# Patient Record
Sex: Female | Born: 1965 | Race: White | Hispanic: No | Marital: Married | Smoking: Never smoker
Health system: Southern US, Community
[De-identification: ages and names within clinical notes are randomized; demographics above are authoritative.]

## PROBLEM LIST (undated history)

## (undated) DIAGNOSIS — M1990 Unspecified osteoarthritis, unspecified site: Secondary | ICD-10-CM

## (undated) DIAGNOSIS — D649 Anemia, unspecified: Secondary | ICD-10-CM

## (undated) DIAGNOSIS — B001 Herpesviral vesicular dermatitis: Secondary | ICD-10-CM

## (undated) DIAGNOSIS — E782 Mixed hyperlipidemia: Secondary | ICD-10-CM

## (undated) DIAGNOSIS — T7840XA Allergy, unspecified, initial encounter: Secondary | ICD-10-CM

## (undated) DIAGNOSIS — R7989 Other specified abnormal findings of blood chemistry: Secondary | ICD-10-CM

## (undated) HISTORY — PX: TONSILLECTOMY: SHX5217

## (undated) HISTORY — PX: REFRACTIVE SURGERY: SHX103

## (undated) HISTORY — DX: Other specified abnormal findings of blood chemistry: R79.89

## (undated) HISTORY — DX: Mixed hyperlipidemia: E78.2

## (undated) HISTORY — DX: Herpesviral vesicular dermatitis: B00.1

## (undated) HISTORY — DX: Allergy, unspecified, initial encounter: T78.40XA

## (undated) HISTORY — PX: WISDOM TOOTH EXTRACTION: SHX21

## (undated) HISTORY — PX: EYE SURGERY: SHX253

---

## 1993-04-27 HISTORY — PX: DILATION AND CURETTAGE OF UTERUS: SHX78

## 2001-11-08 ENCOUNTER — Other Ambulatory Visit: Admission: RE | Admit: 2001-11-08 | Discharge: 2001-11-08 | Payer: Self-pay | Admitting: *Deleted

## 2003-02-01 ENCOUNTER — Other Ambulatory Visit: Admission: RE | Admit: 2003-02-01 | Discharge: 2003-02-01 | Payer: Self-pay | Admitting: *Deleted

## 2004-05-05 ENCOUNTER — Other Ambulatory Visit: Admission: RE | Admit: 2004-05-05 | Discharge: 2004-05-05 | Payer: Self-pay | Admitting: Family Medicine

## 2004-06-04 ENCOUNTER — Ambulatory Visit (HOSPITAL_COMMUNITY): Admission: RE | Admit: 2004-06-04 | Discharge: 2004-06-04 | Payer: Self-pay | Admitting: Family Medicine

## 2006-02-18 ENCOUNTER — Other Ambulatory Visit: Admission: RE | Admit: 2006-02-18 | Discharge: 2006-02-18 | Payer: Self-pay | Admitting: Family Medicine

## 2007-12-01 ENCOUNTER — Other Ambulatory Visit: Admission: RE | Admit: 2007-12-01 | Discharge: 2007-12-01 | Payer: Self-pay | Admitting: Family Medicine

## 2007-12-09 ENCOUNTER — Ambulatory Visit (HOSPITAL_COMMUNITY): Admission: RE | Admit: 2007-12-09 | Discharge: 2007-12-09 | Payer: Self-pay | Admitting: Family Medicine

## 2007-12-09 LAB — HM MAMMOGRAPHY: HM Mammogram: NORMAL

## 2011-04-09 ENCOUNTER — Encounter: Payer: Self-pay | Admitting: *Deleted

## 2011-04-13 ENCOUNTER — Encounter: Payer: Self-pay | Admitting: *Deleted

## 2011-04-13 ENCOUNTER — Other Ambulatory Visit: Payer: Self-pay | Admitting: Family Medicine

## 2011-04-13 ENCOUNTER — Encounter: Payer: Self-pay | Admitting: Family Medicine

## 2011-04-13 ENCOUNTER — Ambulatory Visit (INDEPENDENT_AMBULATORY_CARE_PROVIDER_SITE_OTHER): Payer: 59 | Admitting: Family Medicine

## 2011-04-13 ENCOUNTER — Other Ambulatory Visit (HOSPITAL_COMMUNITY)
Admission: RE | Admit: 2011-04-13 | Discharge: 2011-04-13 | Disposition: A | Discharge status: Home / Self Care | Payer: 59 | Source: Ambulatory Visit | Attending: Family Medicine | Admitting: Family Medicine

## 2011-04-13 VITALS — BP 128/86 | HR 96 | Ht 65.5 in | Wt 198.0 lb

## 2011-04-13 DIAGNOSIS — R5383 Other fatigue: Secondary | ICD-10-CM

## 2011-04-13 DIAGNOSIS — Z01419 Encounter for gynecological examination (general) (routine) without abnormal findings: Secondary | ICD-10-CM | POA: Insufficient documentation

## 2011-04-13 DIAGNOSIS — N926 Irregular menstruation, unspecified: Secondary | ICD-10-CM

## 2011-04-13 DIAGNOSIS — J309 Allergic rhinitis, unspecified: Secondary | ICD-10-CM

## 2011-04-13 DIAGNOSIS — M25511 Pain in right shoulder: Secondary | ICD-10-CM

## 2011-04-13 DIAGNOSIS — M25519 Pain in unspecified shoulder: Secondary | ICD-10-CM

## 2011-04-13 DIAGNOSIS — R5381 Other malaise: Secondary | ICD-10-CM

## 2011-04-13 DIAGNOSIS — B001 Herpesviral vesicular dermatitis: Secondary | ICD-10-CM

## 2011-04-13 DIAGNOSIS — E7800 Pure hypercholesterolemia, unspecified: Secondary | ICD-10-CM

## 2011-04-13 DIAGNOSIS — B009 Herpesviral infection, unspecified: Secondary | ICD-10-CM

## 2011-04-13 DIAGNOSIS — Z23 Encounter for immunization: Secondary | ICD-10-CM

## 2011-04-13 DIAGNOSIS — Z0000 Encounter for general adult medical examination without abnormal findings: Secondary | ICD-10-CM

## 2011-04-13 LAB — POCT URINALYSIS DIPSTICK
Blood, UA: NEGATIVE
Glucose, UA: NEGATIVE
Spec Grav, UA: 1.02
Urobilinogen, UA: NEGATIVE
pH, UA: 5

## 2011-04-13 MED ORDER — VALACYCLOVIR HCL 1 G PO TABS: ORAL_TABLET | ORAL | Status: DC

## 2011-04-13 MED ORDER — FLUTICASONE PROPIONATE 50 MCG/ACT NA SUSP: 2.0000 | NASAL | Status: DC | PRN

## 2011-04-13 NOTE — Patient Instructions (Addendum)
HEALTH MAINTENANCE RECOMMENDATIONS:  It is recommended that you get at least 30 minutes of aerobic exercise at least 5 days/week (for weight loss, you may need as much as 60-90 minutes). This can be any activity that gets your heart rate up. This can be divided in 10-15 minute intervals if needed, but try and build up your endurance at least once a week.  Weight bearing exercise is also recommended twice weekly.  Eat a healthy diet with lots of vegetables, fruits and fiber.  "Colorful" foods have a lot of vitamins (ie green vegetables, tomatoes, red peppers, etc).  Limit sweet tea, regular sodas and alcoholic beverages, all of which has a lot of calories and sugar.  Up to 1 alcoholic drink daily may be beneficial for women (unless trying to lose weight, watch sugars).  Drink a lot of water.  Calcium recommendations are 1200-1500 mg daily (1500 mg for postmenopausal women or women without ovaries), and vitamin D 1000 IU daily.  This should be obtained from diet and/or supplements (vitamins), and calcium should not be taken all at once, but in divided doses.  Monthly self breast exams and yearly mammograms for women over the age of 73 is recommended.  Sunscreen of at least SPF 30 should be used on all sun-exposed parts of the skin when outside between the hours of 10 am and 4 pm (not just when at beach or pool, but even with exercise, golf, tennis, and yard work!)  Use a sunscreen that says "broad spectrum" so it covers both UVA and UVB rays, and make sure to reapply every 1-2 hours.  Remember to change the batteries in your smoke detectors when changing your clock times in the spring and fall.  Use your seat belt every time you are in a car, and please drive safely and not be distracted with cell phones and texting while driving.  Please call to schedule your mammogram (and do them yearly) Please schedule an eye exam, as your vision is somewhat diminished (20/50) Please try and find out if your  father's colon polyps were "adenomas". If they were "hyperplastic" polyps, then it is fine to wait until you are 48 for your first colonoscopy. If they were adenomatous, then I'd screen now.  Take EITHER 2 Aleve twice daily with food OR 800 mg (4 tablets of OTC advil) three times daily with food for 7-10 days.  Do range of motion exercises at least twice daily.   Shoulder Exercises--right now start with just STRETCHING, avoid strengthening until pain is better. Do all exercises 10 repetitions, twice daily, holding for 10-15 seconds EXERCISES  RANGE OF MOTION (ROM) AND STRETCHING EXERCISES These exercises may help you when beginning to rehabilitate your injury. Your symptoms may resolve with or without further involvement from your physician, physical therapist or athletic trainer. While completing these exercises, remember:   Restoring tissue flexibility helps normal motion to return to the joints. This allows healthier, less painful movement and activity.   An effective stretch should be held for at least 30 seconds.   A stretch should never be painful. You should only feel a gentle lengthening or release in the stretched tissue.  ROM - Pendulum  Bend at the waist so that your right / left arm falls away from your body. Support yourself with your opposite hand on a solid surface, such as a table or a countertop.   Your right / left arm should be perpendicular to the ground. If it is not perpendicular, you  need to lean over farther. Relax the muscles in your right / left arm and shoulder as much as possible.   Gently sway your hips and trunk so they move your right / left arm without any use of your right / left shoulder muscles.   Progress your movements so that your right / left arm moves side to side, then forward and backward, and finally, both clockwise and counterclockwise.   Complete __________ repetitions in each direction. Many people use this exercise to relieve discomfort in  their shoulder as well as to gain range of motion.  Repeat __________ times. Complete this exercise __________ times per day. STRETCH - Flexion, Standing  Stand with good posture. With an underhand grip on your right / left hand and an overhand grip on the opposite hand, grasp a broomstick or cane so that your hands are a little more than shoulder-width apart.   Keeping your right / left elbow straight and shoulder muscles relaxed, push the stick with your opposite hand to raise your right / left arm in front of your body and then overhead. Raise your arm until you feel a stretch in your right / left shoulder, but before you have increased shoulder pain.   Try to avoid shrugging your right / left shoulder as your arm rises by keeping your shoulder blade tucked down and toward your mid-back spine. Hold __________ seconds.   Slowly return to the starting position.  Repeat __________ times. Complete this exercise __________ times per day. STRETCH - Internal Rotation  Place your right / left hand behind your back, palm-up.   Throw a towel or belt over your opposite shoulder. Grasp the towel/belt with your right / left hand.   While keeping an upright posture, gently pull up on the towel/belt until you feel a stretch in the front of your right / left shoulder.   Avoid shrugging your right / left shoulder as your arm rises by keeping your shoulder blade tucked down and toward your mid-back spine.   Hold __________. Release the stretch by lowering your opposite hand.  Repeat __________ times. Complete this exercise __________ times per day. STRETCH - External Rotation and Abduction  Stagger your stance through a doorframe. It does not matter which foot is forward.   As instructed by your physician, physical therapist or athletic trainer, place your hands:   And forearms above your head and on the door frame.   And forearms at head-height and on the door frame.   At elbow-height and on the  door frame.   Keeping your head and chest upright and your stomach muscles tight to prevent over-extending your low-back, slowly shift your weight onto your front foot until you feel a stretch across your chest and/or in the front of your shoulders.   Hold __________ seconds. Shift your weight to your back foot to release the stretch.  Repeat __________ times. Complete this stretch __________ times per day.  STRENGTHENING EXERCISES  These exercises may help you when beginning to rehabilitate your injury. They may resolve your symptoms with or without further involvement from your physician, physical therapist or athletic trainer. While completing these exercises, remember:   Muscles can gain both the endurance and the strength needed for everyday activities through controlled exercises.   Complete these exercises as instructed by your physician, physical therapist or athletic trainer. Progress the resistance and repetitions only as guided.   You may experience muscle soreness or fatigue, but the pain or discomfort you  are trying to eliminate should never worsen during these exercises. If this pain does worsen, stop and make certain you are following the directions exactly. If the pain is still present after adjustments, discontinue the exercise until you can discuss the trouble with your clinician.   If advised by your physician, during your recovery, avoid activity or exercises which involve actions that place your right / left hand or elbow above your head or behind your back or head. These positions stress the tissues which are trying to heal.  STRENGTH - Scapular Depression and Adduction  With good posture, sit on a firm chair. Supported your arms in front of you with pillows, arm rests or a table top. Have your elbows in line with the sides of your body.   Gently draw your shoulder blades down and toward your mid-back spine. Gradually increase the tension without tensing the muscles along  the top of your shoulders and the back of your neck.   Hold for __________ seconds. Slowly release the tension and relax your muscles completely before completing the next repetition.   After you have practiced this exercise, remove the arm support and complete it in standing as well as sitting.  Repeat __________ times. Complete this exercise __________ times per day.  STRENGTH - External Rotators  Secure a rubber exercise band/tubing to a fixed object so that it is at the same height as your right / left elbow when you are standing or sitting on a firm surface.   Stand or sit so that the secured exercise band/tubing is at your side that is not injured.   Bend your elbow 90 degrees. Place a folded towel or small pillow under your right / left arm so that your elbow is a few inches away from your side.   Keeping the tension on the exercise band/tubing, pull it away from your body, as if pivoting on your elbow. Be sure to keep your body steady so that the movement is only coming from your shoulder rotating.   Hold __________ seconds. Release the tension in a controlled manner as you return to the starting position.  Repeat __________ times. Complete this exercise __________ times per day.  STRENGTH - Supraspinatus  Stand or sit with good posture. Grasp a __________ weight or an exercise band/tubing so that your hand is "thumbs-up," like when you shake hands.   Slowly lift your right / left hand from your thigh into the air, traveling about 30 degrees from straight out at your side. Lift your hand to shoulder height or as far as you can without increasing any shoulder pain. Initially, many people do not lift their hands above shoulder height.   Avoid shrugging your right / left shoulder as your arm rises by keeping your shoulder blade tucked down and toward your mid-back spine.   Hold for __________ seconds. Control the descent of your hand as you slowly return to your starting position.    Repeat __________ times. Complete this exercise __________ times per day.  STRENGTH - Shoulder Extensors  Secure a rubber exercise band/tubing so that it is at the height of your shoulders when you are either standing or sitting on a firm arm-less chair.   With a thumbs-up grip, grasp an end of the band/tubing in each hand. Straighten your elbows and lift your hands straight in front of you at shoulder height. Step back away from the secured end of band/tubing until it becomes tense.   Squeezing your shoulder blades  together, pull your hands down to the sides of your thighs. Do not allow your hands to go behind you.   Hold for __________ seconds. Slowly ease the tension on the band/tubing as you reverse the directions and return to the starting position.  Repeat __________ times. Complete this exercise __________ times per day.  STRENGTH - Scapular Retractors  Secure a rubber exercise band/tubing so that it is at the height of your shoulders when you are either standing or sitting on a firm arm-less chair.   With a palm-down grip, grasp an end of the band/tubing in each hand. Straighten your elbows and lift your hands straight in front of you at shoulder height. Step back away from the secured end of band/tubing until it becomes tense.   Squeezing your shoulder blades together, draw your elbows back as you bend them. Keep your upper arm lifted away from your body throughout the exercise.   Hold __________ seconds. Slowly ease the tension on the band/tubing as you reverse the directions and return to the starting position.  Repeat __________ times. Complete this exercise __________ times per day. STRENGTH - Scapular Depressors  Find a sturdy chair without wheels, such as a from a dining room table.   Keeping your feet on the floor, lift your bottom from the seat and lock your elbows.   Keeping your elbows straight, allow gravity to pull your body weight down. Your shoulders will rise  toward your ears.   Raise your body against gravity by drawing your shoulder blades down your back, shortening the distance between your shoulders and ears. Although your feet should always maintain contact with the floor, your feet should progressively support less body weight as you get stronger.   Hold __________ seconds. In a controlled and slow manner, lower your body weight to begin the next repetition.  Repeat __________ times. Complete this exercise __________ times per day.  Document Released: 02/25/2005 Document Revised: 12/24/2010 Document Reviewed: 07/26/2008 [facility] Patient Information 2012 [place], [place].

## 2011-04-13 NOTE — Progress Notes (Signed)
Stacy Boyer is a 45 y.o. female who presents for a complete physical.  She has the following concerns: R shoulder pain and decreased range of motion for at least a month.  Pain is a burning sensation running down the front of her upper arm.  Denies neck pain.  Doesn't hurt to vacuum, but hurts to reach up high or behind her--unable to put on her bra.    Health Maintenance: There is no immunization history on file for this patient. Per old records, Td 2003 Last Pap smear: 8/09 Last mammogram: 8/09 Last colonoscopy: never Last DEXA: never Dentist: every 6 months Ophtho: 2 years ago Exercise: none  Past Medical History  Diagnosis Date  . Allergy     seasonal and dust  . Herpes simplex labialis     Past Surgical History  Procedure Date  . Cesarean section   . Tonsillectomy age 45  . Refractive surgery     History   Social History  . Marital Status: Married    Spouse Name: N/A    Number of Children: 2  . Years of Education: N/A   Occupational History  . teaches at [employer] [place]    Social History Main Topics  . Smoking status: Never Smoker   . Smokeless tobacco: Never Used  . Alcohol Use: Yes     less than one drink per week.  . Drug Use: No  . Sexually Active: Yes -- Female partner(s)     husband had vasectomy   Other Topics Concern  . Not on file   Social History Narrative   Lives with 2 sons, husband and 3 dogs    Family History  Problem Relation Age of Onset  . Hyperlipidemia Mother   . Diabetes Mother   . Melanoma Mother   . Arthritis Mother   . Arthritis Father     rheumatoid  . Diabetes Father   . Heart disease Father 79    MI, s/p CABG  . Hypertension Father   . Hyperlipidemia Father   . Diabetes Brother     resolved after gastric bypass/weight loss   . Hypertension Brother     off meds s/p weight loss  . Cancer Paternal Grandmother     breast cancer in 70's  . Heart disease Maternal Grandmother     90 or younger?  . Stroke Maternal  Grandmother     50's   Current outpatient prescriptions:ibuprofen (ADVIL,MOTRIN) 200 MG tablet, Take 400 mg by mouth as needed.  , Disp: , Rfl: ;  Multiple Vitamins-Minerals (MULTIVITAMIN WITH MINERALS) tablet, Take 1 tablet by mouth daily.  , Disp: , Rfl: ;  Probiotic Product (PRO-BIOTIC BLEND) CAPS, Take 1 capsule by mouth daily.  , Disp: , Rfl:  valACYclovir (VALTREX) 1000 MG tablet, Take 2 tablets at onset of cold sore.  Repeat dose of additional 2 tablets in 12 hours (4 tablets per episode), Disp: 30 tablet, Rfl: 1;  DISCONTD: valACYclovir (VALTREX) 1000 MG tablet, Take 1,000 mg by mouth as needed.  , Disp: , Rfl: ;  fluticasone (FLONASE) 50 MCG/ACT nasal spray, Place 2 sprays into the nose as needed for allergies., Disp: 16 g, Rfl: 5 DISCONTD: fluticasone (FLONASE) 50 MCG/ACT nasal spray, Place 2 sprays into the nose as needed.  , Disp: , Rfl:   No Known Allergies  ROS:  The patient denies anorexia, fever, vision changes, decreased hearing, ear pain, sore throat, breast concerns, chest pain, palpitations, dizziness, syncope, dyspnea on exertion, cough, swelling, nausea,  vomiting, diarrhea, constipation, abdominal pain, melena, hematochezia, indigestion/heartburn, hematuria, incontinence, dysuria, irregular menstrual cycles--still fairly regular but have gotten heavier; no vaginal discharge, odor or itch, genital lesions, numbness, tingling, weakness, tremor, suspicious skin lesions, depression, anxiety, abnormal bleeding/bruising, or enlarged lymph nodes. +periods are heavier and seem to last longer. +fatigue; +weight gain over the last several years. Occasional sinus headache. Had some alternating constipation and diarrhea, which has improved since taking probiotics. +some trouble sleeping, thinks related to caffeine.  URI symptoms last week, resolving, just a slight residual cough  PHYSICAL EXAM: Ht 5' 5.5" (1.664 m)  Wt 198 lb (89.812 kg)  BMI 32.45 kg/m2  LMP 03/31/2011  General  Appearance:    Alert, cooperative, no distress, appears stated age, overweight  Head:    Normocephalic, without obvious abnormality, atraumatic  Eyes:    PERRL, conjunctiva/corneas clear, EOM's intact, fundi    benign  Ears:    Normal TM's and external ear canals  Nose:   Nares normal, mucosa normal, no drainage or sinus   tenderness  Throat:   Lips, mucosa, and tongue normal; teeth and gums normal  Neck:   Supple, no lymphadenopathy;  thyroid:  no   enlargement/tenderness/nodules; no carotid   bruit or JVD  Back:    Spine nontender, no curvature, ROM normal, no CVA     tenderness  Lungs:     Clear to auscultation bilaterally without wheezes, rales or     ronchi; respirations unlabored  Chest Wall:    No tenderness or deformity   Heart:    Regular rate and rhythm, S1 and S2 normal, no murmur, rub   or gallop  Breast Exam:    No tenderness, masses, or nipple discharge or inversion.      No axillary lymphadenopathy  Abdomen:     Soft, non-tender, nondistended, normoactive bowel sounds,    no masses, no hepatosplenomegaly  Genitalia:    Normal external genitalia without lesions.  BUS and vagina normal; cervix without lesions, or cervical motion tenderness. No abnormal vaginal discharge.  Uterus and adnexa not enlarged, nontender, no masses.  Pap performed  Rectal:    Normal tone, no masses or tenderness; guaiac negative stool  Extremities:   No clubbing, cyanosis or edema  Pulses:   2+ and symmetric all extremities.  RUE: Slightly limited abduction, forward flexion, significantly reduced internal rotation due to pain.  Normal strength, sensation  Skin:   Skin color, texture, turgor normal, no rashes or lesions  Lymph nodes:   Cervical, supraclavicular, and axillary nodes normal  Neurologic:   CNII-XII intact, normal strength, sensation and gait; reflexes 2+ and symmetric throughout          Psych:   Normal mood, affect, hygiene and grooming.     ASSESSMENT/PLAN:  1. Routine general medical  examination at a health care facility  Visual acuity screening, POCT Urinalysis Dipstick, Cytology - PAP  2. Herpes simplex labialis  valACYclovir (VALTREX) 1000 MG tablet  3. Allergic rhinitis, cause unspecified  fluticasone (FLONASE) 50 MCG/ACT nasal spray  4. Irregular menses  CBC with Differential, TSH  5. Other malaise and fatigue  CBC with Differential, Vitamin D 25 hydroxy, TSH, Comprehensive metabolic panel  6. Pure hypercholesterolemia  Lipid panel  7. Need for Tdap vaccination  Tdap vaccine greater than or equal to 7yo IM  8. Need for prophylactic vaccination and inoculation against influenza  Flu vaccine greater than or equal to 3yo preservative free IM  9. Shoulder pain, right  Shoulder pain: 800 mg ibuprofen three times daily with food (vs Aleve 2 tabs BID).  ROM exercises shown.  Refer to PT if not improving. May need cortisone shot.  Return for re-eval if not improving over the next 2 weeks with NSAIDs and exercises.  NOT to start the strengthening exercises until pain-free.  Discussed monthly self breast exams and yearly mammograms after the age of 29; at least 30 minutes of aerobic activity at least 5 days/week; proper sunscreen use reviewed; healthy diet, including goals of calcium and vitamin D intake and alcohol recommendations (less than or equal to 1 drink/day) reviewed; regular seatbelt use; changing batteries in smoke detectors.  Immunization recommendations discussed--TdaP and flu shot given today.  Colonoscopy recommendations reviewed--age 35, unless mother had adenomatous polyps

## 2011-04-14 LAB — CBC WITH DIFFERENTIAL/PLATELET
Eosinophils Absolute: 0.2 10*3/uL (ref 0.0–0.7)
Eosinophils Relative: 2 % (ref 0–5)
Hemoglobin: 14.2 g/dL (ref 12.0–15.0)
Lymphs Abs: 2.1 10*3/uL (ref 0.7–4.0)
MCH: 30.1 pg (ref 26.0–34.0)
MCV: 92.1 fL (ref 78.0–100.0)
Monocytes Relative: 7 % (ref 3–12)
Platelets: 275 10*3/uL (ref 150–400)
RBC: 4.71 MIL/uL (ref 3.87–5.11)

## 2011-04-14 LAB — COMPREHENSIVE METABOLIC PANEL
Albumin: 4.6 g/dL (ref 3.5–5.2)
BUN: 12 mg/dL (ref 6–23)
Calcium: 9.6 mg/dL (ref 8.4–10.5)
Chloride: 100 mEq/L (ref 96–112)
Glucose, Bld: 88 mg/dL (ref 70–99)
Potassium: 4 mEq/L (ref 3.5–5.3)

## 2011-04-14 LAB — LIPID PANEL
HDL: 59 mg/dL (ref >39)
Triglycerides: 215 mg/dL — ABNORMAL HIGH (ref <150)

## 2011-04-16 ENCOUNTER — Telehealth: Payer: Self-pay | Admitting: Internal Medicine

## 2011-04-16 DIAGNOSIS — R748 Abnormal levels of other serum enzymes: Secondary | ICD-10-CM

## 2011-04-16 DIAGNOSIS — E782 Mixed hyperlipidemia: Secondary | ICD-10-CM

## 2011-04-16 DIAGNOSIS — R7989 Other specified abnormal findings of blood chemistry: Secondary | ICD-10-CM

## 2011-04-16 NOTE — Telephone Encounter (Signed)
I will call back once I get [facility] labs for comparison--please try again to get today.  Thanks

## 2011-04-16 NOTE — Telephone Encounter (Signed)
Spoke with patient.  [facility] labs reviewed--LFT's all normal, and lipids were much better in the past "06 and '09.  Discussed likelihood of this being fatty changes to the liver.  Discussed need for lowfat, low cholesterol diet, and for weight loss.  Discussed avoiding alcohol, excessive tylenol.  Plan is to repeat LFT's in 1 month.  If still elevated, then will need liver u/s.  Will stay on lowfat, low cholesterol diet for 3 months, then re-check lipid (along with LFT's).  Sent low cholesterol info.  All questions answered.  Also will need TSH in 3 months.

## 2011-04-21 ENCOUNTER — Encounter: Payer: Self-pay | Admitting: Family Medicine

## 2011-04-30 ENCOUNTER — Ambulatory Visit: Payer: 59 | Admitting: Family Medicine

## 2011-05-04 ENCOUNTER — Ambulatory Visit (INDEPENDENT_AMBULATORY_CARE_PROVIDER_SITE_OTHER): Payer: 59 | Admitting: Family Medicine

## 2011-05-04 ENCOUNTER — Encounter: Payer: Self-pay | Admitting: Family Medicine

## 2011-05-04 ENCOUNTER — Ambulatory Visit
Admission: RE | Admit: 2011-05-04 | Discharge: 2011-05-04 | Disposition: A | Discharge status: Home / Self Care | Payer: 59 | Source: Ambulatory Visit | Attending: Family Medicine | Admitting: Family Medicine

## 2011-05-04 VITALS — BP 122/86 | HR 68 | Ht 65.5 in | Wt 198.0 lb

## 2011-05-04 DIAGNOSIS — M25511 Pain in right shoulder: Secondary | ICD-10-CM

## 2011-05-04 DIAGNOSIS — M25519 Pain in unspecified shoulder: Secondary | ICD-10-CM

## 2011-05-04 DIAGNOSIS — R7989 Other specified abnormal findings of blood chemistry: Secondary | ICD-10-CM

## 2011-05-04 DIAGNOSIS — E782 Mixed hyperlipidemia: Secondary | ICD-10-CM

## 2011-05-04 NOTE — Patient Instructions (Signed)
Get x-rays of right shoulder and start physical therapy.  Continue with 2 Aleve twice daily. If increased pain, consider returning for cortisone injection.  Continue lowfat, low cholesterol diet.

## 2011-05-04 NOTE — Progress Notes (Signed)
3 week follow up on R shoulder pain-started taking aleve has had some relief in pain but ROM is no better. Has been trying to use her right arm more, and has been doing ROM exercises.  Main limitation is reaching behind her (internal rotation).  We reviewed her lab results--elevated lipids and liver tests and arrangements for follow up.  Past Medical History  Diagnosis Date  . Allergy     seasonal and dust  . Herpes simplex labialis   . Mixed hyperlipidemia   . Other abnormal blood chemistry     Past Surgical History  Procedure Date  . Cesarean section   . Tonsillectomy age 24  . Refractive surgery     History   Social History  . Marital Status: Married    Spouse Name: N/A    Number of Children: 2  . Years of Education: N/A   Occupational History  . teaches at [employer]    Social History Main Topics  . Smoking status: Never Smoker   . Smokeless tobacco: Never Used  . Alcohol Use: Yes     less than one drink per week.  . Drug Use: No  . Sexually Active: Yes -- Female partner(s)     husband had vasectomy   Other Topics Concern  . Not on file   Social History Narrative   Lives with 2 sons, husband and 3 dogs    Family History  Problem Relation Age of Onset  . Hyperlipidemia Mother   . Diabetes Mother   . Melanoma Mother   . Arthritis Mother   . Arthritis Father     rheumatoid  . Diabetes Father   . Heart disease Father 43    MI, s/p CABG  . Hypertension Father   . Hyperlipidemia Father   . Diabetes Brother     resolved after gastric bypass/weight loss   . Hypertension Brother     off meds s/p weight loss  . Cancer Paternal Grandmother     breast cancer in 61's  . Heart disease Maternal Grandmother     54 or younger?  . Stroke Maternal Grandmother     50's   Current Outpatient Prescriptions on File Prior to Visit  Medication Sig Dispense Refill  . fluticasone (FLONASE) 50 MCG/ACT nasal spray Place 2 sprays into the nose as needed for allergies.  16 g   5  . Multiple Vitamins-Minerals (MULTIVITAMIN WITH MINERALS) tablet Take 1 tablet by mouth daily.        . Probiotic Product (PRO-BIOTIC BLEND) CAPS Take 1 capsule by mouth daily.        . valACYclovir (VALTREX) 1000 MG tablet Take 2 tablets at onset of cold sore.  Repeat dose of additional 2 tablets in 12 hours (4 tablets per episode)  30 tablet  1   No Known Allergies  ROS:  Denies abdominal pain, fever, URI symptoms, chest pain or other concerns.  Denies weakness  PHYSICAL EXAM: BP 122/86  Pulse 68  Ht 5' 5.5" (1.664 m)  Wt 198 lb (89.812 kg)  BMI 32.45 kg/m2  LMP 05/01/2011 Well developed, pleasant obese female in no distress Right upper extremity:  Slight discomfort with abduction and forward flexion, decreased ROM by only 5 degrees in these directions.  Significantly limited internal rotation, and pain upon this movement. Pain with internal rotation against resistance on R, but full strength.  No tenderness anteriorly or at [facility] joint. No impingement.  Normal sensation  ASSESSENT/PLAN:  1. Shoulder pain,  right  Ambulatory referral to Physical Therapy, DG Shoulder Right   likely due to tendinitis; possible early frozen shoulder, with some improvement in ROM, but still limited internal rotation  2. Elevated LFTs     re-check in 2 weeks  3. Mixed hyperlipidemia     low cholesterol, lowfat diet and re-check in March    R shoulder tendinitis--PT, continue NSAIDs.  Check x-ray.  Elevated LFT's and cholesterol.  Re-check LFTs in 2 weeks.  Continue low cholesterol diet. Will need lipids checked again in 3 months after appropriate dietary trial, along with TSH.  Lab orders already in system

## 2011-06-02 ENCOUNTER — Other Ambulatory Visit: Payer: 59

## 2011-06-03 ENCOUNTER — Other Ambulatory Visit: Payer: 59

## 2011-06-03 DIAGNOSIS — R748 Abnormal levels of other serum enzymes: Secondary | ICD-10-CM

## 2011-06-03 LAB — HEPATIC FUNCTION PANEL
ALT: 35 U/L (ref 0–35)
AST: 26 U/L (ref 0–37)
Bilirubin, Direct: 0.1 mg/dL (ref 0.0–0.3)
Indirect Bilirubin: 0.2 mg/dL (ref 0.0–0.9)

## 2011-10-08 ENCOUNTER — Ambulatory Visit (INDEPENDENT_AMBULATORY_CARE_PROVIDER_SITE_OTHER): Payer: 59 | Admitting: Family Medicine

## 2011-10-08 ENCOUNTER — Encounter: Payer: Self-pay | Admitting: Family Medicine

## 2011-10-08 VITALS — BP 120/88 | HR 68 | Temp 98.6°F | Ht 65.5 in | Wt 197.0 lb

## 2011-10-08 DIAGNOSIS — R059 Cough, unspecified: Secondary | ICD-10-CM

## 2011-10-08 DIAGNOSIS — R05 Cough: Secondary | ICD-10-CM

## 2011-10-08 MED ORDER — BENZONATATE 200 MG PO CAPS: 200.0000 mg | ORAL_CAPSULE | Freq: Three times a day (TID) | ORAL | Status: AC | PRN

## 2011-10-08 MED ORDER — HYDROCOD POLST-CHLORPHEN POLST 10-8 MG/5ML PO LQCR: 5.0000 mL | Freq: Every evening | ORAL | Status: DC | PRN

## 2011-10-08 NOTE — Patient Instructions (Addendum)
Use the syrup at bedtime--it will make you sleepy and last up to 12 hours, so be careful with driving in the mornings. You may use the pill cough medicine three times during the day, if needed, shouldn't make you sleepy. You may also use mucinex  (plain or DM) along with these other medications, if needed (ie if cough not improving).  Call for z-pak next week if persistent cough.  Return if shortness of breath, pain with breathing, high fevers, etc for re-evaluation

## 2011-10-08 NOTE — Progress Notes (Signed)
Chief Complaint  Patient presents with  . Cough    since Sunday morning, chest burning and sore throat.   HPI: 5 day h/o cough.  Also has had some headache, slight nasal congestion (x 1 day) and sore throat.  Denies fevers, chills.  She was out of town last weekend, and exposed to sick aunt, who is now hospitalized--with COPD exacerbation.  Cough is keeping her awake at night.  Cough is nonproductive.  Allergies seem to be well controlled right now--only using flonase prn.  OTC meds tried: Mucinex initially, then robitussin and Delsym. Nothing seemed to help with the cough.   Past Medical History  Diagnosis Date  . Allergy     seasonal and dust  . Herpes simplex labialis   . Mixed hyperlipidemia   . Other abnormal blood chemistry    Past Surgical History  Procedure Date  . Cesarean section   . Tonsillectomy age 68  . Refractive surgery    History   Social History  . Marital Status: Married    Spouse Name: N/A    Number of Children: 2  . 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 Smoking status: Never Smoker   . Smokeless tobacco: Never Used  . Alcohol Use: Yes     less than one drink per week.  . Drug Use: No  . Sexually Active: Yes -- Female partner(s)     husband had vasectomy   Other Topics Concern  . Not on file   Social History Narrative   Lives with 2 sons, husband and 3 dogs   Current Outpatient Prescriptions on File Prior to Visit  Medication Sig Dispense Refill  . fluticasone (FLONASE) 50 MCG/ACT nasal spray Place 2 sprays into the nose as needed for allergies.  16 g  5  . Multiple Vitamins-Minerals (MULTIVITAMIN WITH MINERALS) tablet Take 1 tablet by mouth daily.        . naproxen sodium (ANAPROX) 220 MG tablet Take 440 mg by mouth 2 (two) times daily with a meal.        . Probiotic Product (PRO-BIOTIC BLEND) CAPS Take 1 capsule by mouth daily.        . valACYclovir (VALTREX) 1000 MG tablet Take  2 tablets at onset of cold sore.  Repeat dose of additional 2 tablets in 12 hours (4 tablets per episode)  30 tablet  1   No Known Allergies  ROS:  Denies nausea, vomiting, diarrhea, skin rash, myalgias, sinus pain, fevers, shortness of breath, or other concerns.  PHYSICAL EXAM: BP 120/88  Pulse 68  Temp 98.6 F (37 C) (Oral)  Ht 5' 5.5" (1.664 m)  Wt 197 lb (89.359 kg)  BMI 32.28 kg/m2  LMP 10/04/2011 Intermittent dry hacky coughing, otherwise in no distress. Speaking easily in full sentences.  Well appearing. HEENT:  PERRL, EOMI, conjunctiva clear.  TM's and EAC's normal.  Nasal mucosa normal.  Sinuses nontender.  OP with some erythema posteriorly, no exudates. Neck: no lymphadenopathy, thyromegaly or mass Heart: regular rate and rhythm without murmur Lungs: clear bilaterally.  Good air movement.  No wheeze or cough with forced expiration (coughed once after forced expiration, but no other times, just slight cough) Extremities: no edema Skin: no rash  ASSESSMENT/PLAN: 1. Cough  chlorpheniramine-HYDROcodone (TUSSIONEX PENNKINETIC ER) 10-8 MG/5ML LQCR, benzonatate (TESSALON) 200 MG capsule   Most likely due to URI, viral, rather than acute bacterial infection.  Will treat symptomatically for cough.  Risks and side effects of meds were reviewed. Discussed z-pak if worsening cough, fever.

## 2011-10-09 ENCOUNTER — Telehealth: Payer: Self-pay | Admitting: Family Medicine

## 2011-10-09 MED ORDER — AZITHROMYCIN 250 MG PO TABS: ORAL_TABLET | ORAL | Status: AC

## 2011-10-09 NOTE — Telephone Encounter (Signed)
LM

## 2011-10-09 NOTE — Telephone Encounter (Signed)
done

## 2011-10-12 ENCOUNTER — Telehealth: Payer: Self-pay | Admitting: Internal Medicine

## 2011-10-12 NOTE — Telephone Encounter (Signed)
Patient advised. She will call for OV if not improving.

## 2011-10-12 NOTE — Telephone Encounter (Signed)
She was started on z-pak last Friday--assure patient that although she only takes it for 5 days, it stays in her system for 10.  At this point, she should only be mildly improved--and she is, so it appears that the antibiotic is helping.  She should continue the mucinex to help with chest congestion and keep the phlegm thin, and she may continue with the other cough meds rx'd.  There was no evidence of wheezing on her exam.  If she is developing shortness of breath, needs OV for evaluation, otherwise, should continue to improve over the next few days.

## 2012-01-07 ENCOUNTER — Ambulatory Visit (INDEPENDENT_AMBULATORY_CARE_PROVIDER_SITE_OTHER): Payer: 59 | Admitting: Family Medicine

## 2012-01-07 ENCOUNTER — Encounter: Payer: Self-pay | Admitting: Family Medicine

## 2012-01-07 VITALS — BP 122/82 | HR 72 | Temp 97.6°F | Ht 65.5 in | Wt 196.0 lb

## 2012-01-07 DIAGNOSIS — M722 Plantar fascial fibromatosis: Secondary | ICD-10-CM

## 2012-01-07 DIAGNOSIS — J309 Allergic rhinitis, unspecified: Secondary | ICD-10-CM

## 2012-01-07 DIAGNOSIS — M720 Palmar fascial fibromatosis [Dupuytren]: Secondary | ICD-10-CM

## 2012-01-07 MED ORDER — FLUTICASONE PROPIONATE 50 MCG/ACT NA SUSP: 2.0000 | NASAL | Status: DC | PRN

## 2012-01-07 NOTE — Patient Instructions (Signed)
Restart Flonase, use Zyrtec daily. Continue Mucinex Call if worsening, shortness of breath, especially if tight/wheezy, or worse with exercise.  We are going to refer you to Dr. Amanda Pea for evaluation of hand/foot--call here by middle/end of next week if you haven't received appointment.

## 2012-01-07 NOTE — Progress Notes (Signed)
Chief Complaint  Patient presents with  . Cough    and tickle in throat x 2 weeks. No sinus pain/pressure. Does state that she is extremely fatigued x 1 week. Also wants you to take a look at her foot if you have time.   HPI: Complaining of tickle in her throat and dry cough.  Started while watching TV at night.  Denies runny nose, sniffling, sneezing.  Feels like there is something caught in her throat, like a hair is stuck.  Denies postnasal drainage or throat clearing.  No fevers, just some hot flashes.  Mucus when she blows nose (rarely) is clear. Cough is nonproductive--maybe felt like there was slight phlegm initially, not now.  Denies any increased cough with exertion; is a little worse at night.  Started taking Mucinex (plain during day, DM at night)--hasn't notice much difference. She takes zyrtec sporadically, mainly related to itchy eyes.  She has history of allergies, treated with flonase in the past, not using now, as she hasn't had much congestion.  Uses zyrtec as needed, as above.  No sick contacts.  No heartburn.  Dupuytren's contracture on R hand.  Feels something similar on her R foot.,  Feels a hard area in her right arch.  Had some discomfort with running, but only mild. Denies numbness, tingling or other complaints.  Past Medical History  Diagnosis Date  . Allergy     seasonal and dust  . Herpes simplex labialis   . Mixed hyperlipidemia   . Other abnormal blood chemistry    Past Surgical History  Procedure Date  . Cesarean section   . Tonsillectomy age 53  . Refractive surgery    Current Outpatient Prescriptions on File Prior to Visit  Medication Sig Dispense Refill  . Multiple Vitamins-Minerals (MULTIVITAMIN WITH MINERALS) tablet Take 1 tablet by mouth daily.        . Probiotic Product (PRO-BIOTIC BLEND) CAPS Take 1 capsule by mouth daily.        . chlorpheniramine-HYDROcodone (TUSSIONEX PENNKINETIC ER) 10-8 MG/5ML LQCR Take 5 mLs by mouth at bedtime as needed.   100 mL  0  . fluticasone (FLONASE) 50 MCG/ACT nasal spray Place 2 sprays into the nose as needed for allergies.  16 g  5  . naproxen sodium (ANAPROX) 220 MG tablet Take 440 mg by mouth 2 (two) times daily with a meal.        . valACYclovir (VALTREX) 1000 MG tablet Take 2 tablets at onset of cold sore.  Repeat dose of additional 2 tablets in 12 hours (4 tablets per episode)  30 tablet  1   History   Social History  . Marital Status: Married    Spouse Name: N/A    Number of Children: 2  . 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 Smoking status: Never Smoker   . Smokeless tobacco: Never Used  . Alcohol Use: Yes     less than one drink per week.  . Drug Use: No  . Sexually Active: Yes -- Female partner(s)     husband had vasectomy   Other Topics Concern  . Not on file   Social History Narrative   Lives with 2 sons, husband and 3 dogs   ROS:  Denies fevers, shortness of breath, chest pain, palpitations, numbness, tingling, nausea, vomiting, heartburn, skin rash, or other complaints except as per HPI.  PHYSICAL EXAM: BP 122/82  Pulse 72  Temp 97.6 F (36.4 C) (Oral)  Ht 5' 5.5" (1.664 m)  Wt 196 lb (88.905 kg)  BMI 32.12 kg/m2  LMP 01/02/2012 Well developed, pleasant female in no distress HEENT: Nasal mucosa mild-moderately edematous L>R. OP clear without cobblestoning. Lungs clear. No wheeze with forced expiration, although slight cough intermittently with forced expiration. Heart: regular rate and rhythm without murmur Abdomen: soft, nontender, no mass Extremities: no edema.  R hand--contracture of 5th finger, with thickening/firmness between 4h and 5th metacarpals.  There is also a firm mass/thickening along medial arch of right foot.   Skin: no rash Psych: normal mood, affect, hygiene and grooming.  ASSESSMENT/PLAN: 1. Allergic rhinitis, cause unspecified  fluticasone (FLONASE) 50 MCG/ACT nasal spray  2.  Dupuytren's contracture of right hand  Ambulatory referral to Orthopedic Surgery  3. Dupuytren's contracture of foot  Ambulatory referral to Orthopedic Surgery    Cough, tickle in throat and fatigue--mostly likely due to allergies.  Restart Flonase, use Zyrtec daily. Continue Mucinex  Dupuytren's contracture of R hand and R foot.  Refer to ortho for evaluation/treatment.  Discussed new injectable medication as an alternative to surgery.    Suspect dupuytren's and similar process vs ganglion cyst in foot.   Refer to Dr. Lynann Bologna let him eval and decide if he will treat foot vs refer elsewhere

## 2012-01-10 ENCOUNTER — Encounter: Payer: Self-pay | Admitting: Family Medicine

## 2012-03-19 ENCOUNTER — Other Ambulatory Visit: Payer: Self-pay | Admitting: Family Medicine

## 2012-03-19 DIAGNOSIS — B001 Herpesviral vesicular dermatitis: Secondary | ICD-10-CM

## 2012-03-19 MED ORDER — VALACYCLOVIR HCL 1 G PO TABS: ORAL_TABLET | ORAL | Status: DC

## 2012-03-21 ENCOUNTER — Encounter (HOSPITAL_COMMUNITY): Payer: Self-pay | Admitting: Emergency Medicine

## 2012-03-21 ENCOUNTER — Emergency Department (HOSPITAL_COMMUNITY)
Admission: EM | Admit: 2012-03-21 | Discharge: 2012-03-21 | Disposition: A | Discharge status: Home / Self Care | Payer: 59 | Attending: Emergency Medicine | Admitting: Emergency Medicine

## 2012-03-21 ENCOUNTER — Emergency Department (HOSPITAL_COMMUNITY): Payer: 59

## 2012-03-21 DIAGNOSIS — Z79899 Other long term (current) drug therapy: Secondary | ICD-10-CM | POA: Insufficient documentation

## 2012-03-21 DIAGNOSIS — M549 Dorsalgia, unspecified: Secondary | ICD-10-CM

## 2012-03-21 DIAGNOSIS — E785 Hyperlipidemia, unspecified: Secondary | ICD-10-CM | POA: Insufficient documentation

## 2012-03-21 DIAGNOSIS — Z8619 Personal history of other infectious and parasitic diseases: Secondary | ICD-10-CM | POA: Insufficient documentation

## 2012-03-21 MED ORDER — GI COCKTAIL ~~LOC~~
30.0000 mL | Freq: Once | ORAL | Status: AC
  Administered 2012-03-21: 30 mL via ORAL
  Filled 2012-03-21: qty 30

## 2012-03-21 NOTE — ED Notes (Signed)
Spasms in back behind shoulder blades  They come and go she states  No dysuria she states  No injury

## 2012-03-21 NOTE — ED Provider Notes (Signed)
History    45yF with back pain. Pt tried swallowing 2 valtrex pills with am she is taking for cold sores. Felt like got stuck. Just after this began having pain in the middle of her upper back which she describes as spasms. Feels deep in her back. Achy pain coming in waves. Gradual improved and almost completely subsided currently. No SOB. No CP. No cough. No vomiting/retching. No numbness or tingling. No dizziness or lightheadedness. No abdominal pain. No unusual leg pain or swelling.  CSN: [id number]  Arrival date & time 03/21/12  6295   First MD Initiated Contact with Patient 03/21/12 0755      No chief complaint on file.   (Consider location/radiation/quality/duration/timing/severity/associated sxs/prior treatment) HPI  Past Medical History  Diagnosis Date  . Allergy     seasonal and dust  . Herpes simplex labialis   . Mixed hyperlipidemia   . Other abnormal blood chemistry     Past Surgical History  Procedure Date  . Cesarean section   . Tonsillectomy age 32  . Refractive surgery     Family History  Problem Relation Age of Onset  . Hyperlipidemia Mother   . Diabetes Mother   . Melanoma Mother   . Arthritis Mother   . Arthritis Father     rheumatoid  . Diabetes Father   . Heart disease Father 27    MI, s/p CABG  . Hypertension Father   . Hyperlipidemia Father   . Diabetes Brother     resolved after gastric bypass/weight loss   . Hypertension Brother     off meds s/p weight loss  . Cancer Paternal Grandmother     breast cancer in 50's  . Heart disease Maternal Grandmother     33 or younger?  . Stroke Maternal Grandmother     50's    History  Substance Use Topics  . Smoking status: Never Smoker   . Smokeless tobacco: Never Used  . Alcohol Use: Yes     Comment: less than one drink per week.    OB History    Grav Para Term Preterm Abortions TAB SAB Ect Mult Living   3 2   1  1          Review of Systems   Review of symptoms negative unless  otherwise noted in HPI.   Allergies  Review of patient's allergies indicates no known allergies.  Home Medications   Current Outpatient Rx  Name  Route  Sig  Dispense  Refill  . MULTI-VITAMIN/MINERALS PO TABS   Oral   Take 1 tablet by mouth daily.           Marland Kitchen NAPROXEN SODIUM 220 MG PO TABS   Oral   Take 440 mg by mouth 2 (two) times daily with a meal.           . VALACYCLOVIR HCL 1 G PO TABS      Take 2 tablets at onset of cold sore.  Repeat dose of additional 2 tablets in 12 hours (4 tablets per episode)   30 tablet   1     BP 136/74  Pulse 78  Temp 98.4 F (36.9 C) (Oral)  Resp 16  SpO2 100%  Physical Exam  Nursing note and vitals reviewed. Constitutional: She appears well-developed and well-nourished. No distress.  HENT:  Head: Normocephalic and atraumatic.       Small lesions upper lip consistent with herpes simplex   Eyes: Conjunctivae normal are normal. Right eye  exhibits no discharge. Left eye exhibits no discharge.  Neck: Normal range of motion. Neck supple.       No crepitus  Cardiovascular: Normal rate, regular rhythm and normal heart sounds.  Exam reveals no gallop and no friction rub.   No murmur heard.      No murmur appreciated. Radial pulses symmetric.   Pulmonary/Chest: Effort normal and breath sounds normal. No stridor. No respiratory distress.  Abdominal: Soft. She exhibits no distension. There is no tenderness.       No pulsatile mass  Musculoskeletal: She exhibits no edema and no tenderness.       Back pain not reproducible. Lower extremities symmetric as compared to each other. No calf tenderness. Negative Homan's. No palpable cords.   Neurological: She is alert.  Skin: Skin is warm and dry. She is not diaphoretic.  Psychiatric: She has a normal mood and affect. Her behavior is normal. Thought content normal.    ED Course  Procedures (including critical care time)  Labs Reviewed - No data to display Dg Chest 2 View  03/21/2012   *RADIOLOGY REPORT*  Clinical Data: Chest pain.  CHEST - 2 VIEW  Comparison: None.  Findings: Cardiomediastinal silhouette appears normal. No acute pulmonary disease is noted. Bony thorax is intact.  IMPRESSION: No acute cardiopulmonary abnormality seen.   Original Report Authenticated By: Lupita Raider.,  M.D.    EKG:  Rhythm: normal sinus Rate: 77 Axis: normal Intervals: normal ST segments: normal    1. Back pain       MDM  45yF with spasmodic upper back pain. Possible esophageal spasm although c/o more back pain than CP. Consider dissection but doubt. No vomiting/retching to suggest esophageal rupture. No CP or SOB. Atypical for ACS and doubt. Doubt PE. Doubt infectious or pneumothorax. CXR clear. EKG with normal intervals and no ischemic changes. Low suspicion for emergent etiology. Return precautions discussed. Outpt fu.        Raeford Razor, MD 03/21/12 [phone]

## 2012-04-01 ENCOUNTER — Other Ambulatory Visit: Payer: Self-pay | Admitting: Family Medicine

## 2012-04-01 DIAGNOSIS — Z1231 Encounter for screening mammogram for malignant neoplasm of breast: Secondary | ICD-10-CM

## 2012-05-10 ENCOUNTER — Ambulatory Visit
Admission: RE | Admit: 2012-05-10 | Discharge: 2012-05-10 | Disposition: A | Discharge status: Home / Self Care | Payer: 59 | Source: Ambulatory Visit | Attending: Family Medicine | Admitting: Family Medicine

## 2012-05-10 DIAGNOSIS — Z1231 Encounter for screening mammogram for malignant neoplasm of breast: Secondary | ICD-10-CM

## 2012-05-27 ENCOUNTER — Encounter: Payer: Self-pay | Admitting: Family Medicine

## 2012-06-01 ENCOUNTER — Ambulatory Visit (INDEPENDENT_AMBULATORY_CARE_PROVIDER_SITE_OTHER): Payer: 59 | Admitting: Family Medicine

## 2012-06-01 ENCOUNTER — Encounter: Payer: Self-pay | Admitting: Family Medicine

## 2012-06-01 VITALS — BP 134/76 | HR 68 | Ht 65.0 in | Wt 207.0 lb

## 2012-06-01 DIAGNOSIS — E782 Mixed hyperlipidemia: Secondary | ICD-10-CM

## 2012-06-01 DIAGNOSIS — Z0000 Encounter for general adult medical examination without abnormal findings: Secondary | ICD-10-CM

## 2012-06-01 DIAGNOSIS — R5383 Other fatigue: Secondary | ICD-10-CM

## 2012-06-01 DIAGNOSIS — R7989 Other specified abnormal findings of blood chemistry: Secondary | ICD-10-CM

## 2012-06-01 DIAGNOSIS — N926 Irregular menstruation, unspecified: Secondary | ICD-10-CM | POA: Insufficient documentation

## 2012-06-01 DIAGNOSIS — R5381 Other malaise: Secondary | ICD-10-CM

## 2012-06-01 DIAGNOSIS — E669 Obesity, unspecified: Secondary | ICD-10-CM | POA: Insufficient documentation

## 2012-06-01 LAB — CBC WITH DIFFERENTIAL/PLATELET
Eosinophils Relative: 4 % (ref 0–5)
HCT: 37.9 % (ref 36.0–46.0)
Lymphocytes Relative: 24 % (ref 12–46)
Lymphs Abs: 1.5 10*3/uL (ref 0.7–4.0)
MCV: 88.1 fL (ref 78.0–100.0)
Monocytes Absolute: 1 10*3/uL (ref 0.1–1.0)
RBC: 4.3 MIL/uL (ref 3.87–5.11)
WBC: 6.3 10*3/uL (ref 4.0–10.5)

## 2012-06-01 LAB — LIPID PANEL
Cholesterol: 179 mg/dL (ref 0–200)
Total CHOL/HDL Ratio: 4.3 Ratio
Triglycerides: 142 mg/dL (ref <150)
VLDL: 28 mg/dL (ref 0–40)

## 2012-06-01 LAB — POCT URINALYSIS DIPSTICK
Blood, UA: NEGATIVE
Ketones, UA: NEGATIVE
Protein, UA: NEGATIVE
Spec Grav, UA: 1.015

## 2012-06-01 LAB — COMPREHENSIVE METABOLIC PANEL
AST: 18 U/L (ref 0–37)
Alkaline Phosphatase: 73 U/L (ref 39–117)
BUN: 13 mg/dL (ref 6–23)
Creat: 0.94 mg/dL (ref 0.50–1.10)
Potassium: 4.2 mEq/L (ref 3.5–5.3)

## 2012-06-01 NOTE — Patient Instructions (Addendum)
HEALTH MAINTENANCE RECOMMENDATIONS:  It is recommended that you get at least 30 minutes of aerobic exercise at least 5 days/week (for weight loss, you may need as much as 60-90 minutes). This can be any activity that gets your heart rate up. This can be divided in 10-15 minute intervals if needed, but try and build up your endurance at least once a week.  Weight bearing exercise is also recommended twice weekly.  Eat a healthy diet with lots of vegetables, fruits and fiber.  "Colorful" foods have a lot of vitamins (ie green vegetables, tomatoes, red peppers, etc).  Limit sweet tea, regular sodas and alcoholic beverages, all of which has a lot of calories and sugar.  Up to 1 alcoholic drink daily may be beneficial for women (unless trying to lose weight, watch sugars).  Drink a lot of water.  Calcium recommendations are 1200-1500 mg daily (1500 mg for postmenopausal women or women without ovaries), and vitamin D 1000 IU daily.  This should be obtained from diet and/or supplements (vitamins), and calcium should not be taken all at once, but in divided doses.  Monthly self breast exams and yearly mammograms for women over the age of 29 is recommended.  Sunscreen of at least SPF 30 should be used on all sun-exposed parts of the skin when outside between the hours of 10 am and 4 pm (not just when at beach or pool, but even with exercise, golf, tennis, and yard work!)  Use a sunscreen that says "broad spectrum" so it covers both UVA and UVB rays, and make sure to reapply every 1-2 hours.  Remember to change the batteries in your smoke detectors when changing your clock times in the spring and fall.  Use your seat belt every time you are in a car, and please drive safely and not be distracted with cell phones and texting while driving.  Return the hemoccult stool cards at your convenience--when NOT having any vaginal bleeding, hemorrhoid flares or other bleeding noted (do it when you think everything is  NORMAL).

## 2012-06-01 NOTE — Progress Notes (Signed)
Chief Complaint  Patient presents with  . Annual Exam    nonfasting annual exam, labs done. No vision done-just ot new glasses. Wansts to discuss her weight and her periods with you today.   Stacy Boyer is a 47 y.o. female who presents for a complete physical.  She has the following concerns:  [place] in [place], running 3-4 days/week, and wasn't able to lose any weight, however she maintained a stable wait.  After getting a promotion at her job in November, her hours changed some, and hasn't been exercising as regularly since then--she has gained weight since stopping running.    Feels fatigued, unmotivated to exercise (but no problems at work with fatigue or lack of motivation).   She is also complaining that her periods have gotten consistently worse--unpredictable, fluctuating 4-6 weeks apart.  Periods are heavy for several days.  Asking about ablation procedure.  Hyperlipidemia--tries to follow a lowfat, low cholesterol diet. Borderline thyroid test last year--never had repeat as was recommended.  Health Maintenance: Immunization History  Administered Date(s) Administered  . Influenza Split 04/13/2011  . Tdap 04/13/2011  no flu shot this year Last Pap smear: 03/2011 Last mammogram: 04/2012 Last colonoscopy: never Last DEXA: never Dentist: twice a year Ophtho: recent, just got new glasses this Fall Exercise:  Not recently, since November.   Past Medical History  Diagnosis Date  . Allergy     seasonal and dust  . Herpes simplex labialis   . Mixed hyperlipidemia   . Other abnormal blood chemistry     Past Surgical History  Procedure Date  . Cesarean section   . Tonsillectomy age 47  . Refractive surgery     History   Social History  . Marital Status: Married    Spouse Name: N/A    Number of Children: 2  . Years of Education: N/A   Occupational History  . 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 Smoking status: Never  Smoker   . Smokeless tobacco: Never Used  . Alcohol Use: Yes     Comment: less than one drink per week.  . Drug Use: No  . Sexually Active: Yes -- Female partner(s)     Comment: husband had vasectomy   Other Topics Concern  . Not on file   Social History Narrative   Lives with 2 sons, husband and 3 dogs    Family History  Problem Relation Age of Onset  . Hyperlipidemia Mother   . Diabetes Mother   . Melanoma Mother   . Arthritis Mother   . Arthritis Father     rheumatoid  . Diabetes Father   . Heart disease Father 47    MI, s/p CABG  . Hypertension Father   . Hyperlipidemia Father   . Diabetes Brother     resolved after gastric bypass/weight loss   . Hypertension Brother     off meds s/p weight loss  . Cancer Paternal Grandmother     breast cancer in 30's  . Heart disease Maternal Grandmother     63 or younger?  . Stroke Maternal Grandmother     64's  . Cancer Paternal Aunt     breast cancer in 2 paternal aunts    Current outpatient prescriptions:Multiple Vitamins-Minerals (MULTIVITAMIN WITH MINERALS) tablet, Take 1 tablet by mouth daily.  , Disp: , Rfl: ;  naproxen sodium (ANAPROX) 220 MG tablet, Take 440 mg by mouth 2 (two) times  daily with a meal.  , Disp: , Rfl: ;  valACYclovir (VALTREX) 1000 MG tablet, Take 2 tablets at onset of cold sore.  Repeat dose of additional 2 tablets in 12 hours (4 tablets per episode), Disp: 30 tablet, Rfl: 1  Admits to frequently forgetting to take her MVI  No Known Allergies  ROS: The patient denies anorexia, fever, vision changes, decreased hearing, ear pain, sore throat, breast concerns, chest pain, palpitations, dizziness, syncope, dyspnea on exertion, swelling, nausea, vomiting, diarrhea, constipation, abdominal pain, melena, hematochezia, indigestion/heartburn, hematuria, incontinence, dysuria; no vaginal discharge, odor or itch, genital lesions, numbness, tingling, weakness, tremor, suspicious skin lesions, depression, anxiety,  abnormal bleeding/bruising, or enlarged lymph nodes.  +periods are heavier and seem to last longer, irregular. +fatigue; +weight gain over the last several years.   Slight cough started yesterday, otherwise hasn't been sick. +weight gain as per HPI Shoulder pain resolved after PT (which included trigger point injections) Oral HSV infrequent (only 1 episode since November).  PHYSICAL EXAM:  BP 134/76  Pulse 68  Ht 5\' 5"  (1.651 m)  Wt 207 lb (93.895 kg)  BMI 34.45 kg/m2  General Appearance:  Alert, cooperative, no distress, appears stated age, overweight   Head:  Normocephalic, without obvious abnormality, atraumatic   Eyes:  PERRL, conjunctiva/corneas clear, EOM's intact, fundi  benign   Ears:  Normal TM's and external ear canals   Nose:  Nares normal, mucosa normal, no drainage or sinus tenderness   Throat:  Lips, mucosa, and tongue normal; teeth and gums normal   Neck:  Supple, no lymphadenopathy; thyroid: no enlargement/tenderness/nodules; no carotid  bruit or JVD   Back:  Spine nontender, no curvature, ROM normal, no CVA tenderness   Lungs:  Clear to auscultation bilaterally without wheezes, rales or ronchi; respirations unlabored   Chest Wall:  No tenderness or deformity   Heart:  Regular rate and rhythm, S1 and S2 normal, no murmur, rub  or gallop   Breast Exam:  No tenderness, masses, or nipple discharge or inversion. No axillary lymphadenopathy   Abdomen:  Soft, non-tender, nondistended, normoactive bowel sounds,  no masses, no hepatosplenomegaly   Genitalia:  Normal external genitalia without lesions. BUS and vagina normal; cervix without motion tenderness. No abnormal vaginal discharge, although some brown discharge/spotting noted on glove after bimanual exam. Uterus and adnexa not enlarged, nontender, no masses. Pap not performed   Rectal:  Normal tone, no masses or tenderness; guaiac positive stool, but likely contaminated by vaginal spotting  Extremities:  No clubbing,  cyanosis or edema   Pulses:  2+ and symmetric all extremities.   Skin:  Skin color, texture, turgor normal, no rashes or lesions   Lymph nodes:  Cervical, supraclavicular, and axillary nodes normal   Neurologic:  CNII-XII intact, normal strength, sensation and gait; reflexes 2+ and symmetric throughout           Psych: Normal mood, affect, hygiene and grooming.   ASSESSMENT/PLAN:  1. Routine general medical examination at a health care facility  POCT Urinalysis Dipstick  2. Mixed hyperlipidemia  Lipid panel  3. Elevated LFTs  Comprehensive metabolic panel  4. Other malaise and fatigue  TSH, Vitamin D 25 hydroxy, CBC with Differential  5. Irregular menses  TSH  6. Obesity (BMI 30.0-34.9)      Discussed monthly self breast exams and yearly mammograms after the age of 38; at least 30 minutes of aerobic activity at least 5 days/week; proper sunscreen use reviewed; healthy diet, including goals of calcium and  vitamin D intake and alcohol recommendations (less than or equal to 1 drink/day) reviewed; regular seatbelt use; changing batteries in smoke detectors. Immunization recommendations discussed--discussed getting flu shots earlier in season next year.  Declines today. Colonoscopy recommendations reviewed--age 64, unless family history changes, or hemoccult + at home. Hemoccult cards given.  Counseled extensively regarding diet, exercise and weight control, what she can try if thyroid tests are normal.  Discussed thyroid replacement if TSH abnormal. If normal, and lipids still high, refer to [facility] for nutrition consult.  If not covered, then refer to [facility] instead.  Irregular menses.  R/o thyroid disease.  If TSH negative, discussed using low dose OCP's to control her cycles, vs referral to GYN to discuss ablation.  Especially recommended treatment (either) if bleeding is significant enough to be causing anemia (was normal last year).  Oral HSV--continue prn Valtrex.  Call when  refills needed (still has one left at pharmacy).

## 2012-06-08 ENCOUNTER — Telehealth: Payer: Self-pay | Admitting: Family Medicine

## 2012-06-08 DIAGNOSIS — R059 Cough, unspecified: Secondary | ICD-10-CM

## 2012-06-08 DIAGNOSIS — R05 Cough: Secondary | ICD-10-CM

## 2012-06-08 MED ORDER — BENZONATATE 200 MG PO CAPS: 200.0000 mg | ORAL_CAPSULE | Freq: Three times a day (TID) | ORAL | Status: DC | PRN

## 2012-06-08 NOTE — Telephone Encounter (Signed)
Spoke with patient and she is having cough all day, and would like the TID medication called in to [facility]. She will call for appt if symptoms change or worsen.

## 2012-06-08 NOTE — Telephone Encounter (Signed)
Error

## 2012-06-08 NOTE — Telephone Encounter (Signed)
Called patient and left message for her to return my call. 

## 2012-06-08 NOTE — Telephone Encounter (Signed)
Please see if other symptoms have changed--ie is there discolored mucus (phlegm or nasal), fever, shortness of breath?  Is cough worse during day or at night?  If just needing a cough suppressant at night (cough tolerable during day, no fever, and no discolored mucus/phlegm or shortness of breath), then ok for Tussionex 75cc, to use 1 teaspoon at bedtime as needed for cough.  Let her know this is a narcotic, and sedating.  If her cough is more bothersome during the day (and no other change in symptoms), then rx benzonatate 200mg  TID prn cough.  She should continue guaifenesin if having thick mucus/phlegm or any chest congestion.    OV if worsening cough (she had just started with symptoms at her physical), fever, discolored mucus, shortness of breath for full evaluation.

## 2012-06-08 NOTE — Telephone Encounter (Signed)
Pt called and stated that when she was here for her office visit she had a cough. She states that you guys discussed it and you recommended some otc things to help. She states that since then it has gotten much worse and she is requesting something be called in for her. Pt uses [facility] at the corner of [address].

## 2012-06-22 ENCOUNTER — Telehealth: Payer: Self-pay | Admitting: Family Medicine

## 2012-06-22 MED ORDER — AMOXICILLIN 875 MG PO TABS: 875.0000 mg | ORAL_TABLET | Freq: Two times a day (BID) | ORAL | Status: DC

## 2012-06-22 NOTE — Telephone Encounter (Signed)
Pt called and stated that she is worse. She states that the cough med did help but she feels that it is now URI. States she has green mucus and chest congestion. Pt is requesting something else be called in. Pt states that she still has cough medication. Pt uses [facility] on [address]

## 2012-06-22 NOTE — Telephone Encounter (Signed)
Patient informed. 

## 2012-06-22 NOTE — Telephone Encounter (Signed)
Advise pt that Amox was sent to her pharmacy

## 2012-09-28 ENCOUNTER — Ambulatory Visit (INDEPENDENT_AMBULATORY_CARE_PROVIDER_SITE_OTHER): Payer: 59 | Admitting: Family Medicine

## 2012-09-28 ENCOUNTER — Encounter: Payer: Self-pay | Admitting: Family Medicine

## 2012-09-28 VITALS — BP 120/86 | HR 84 | Temp 98.9°F | Ht 65.0 in | Wt 207.0 lb

## 2012-09-28 DIAGNOSIS — L03119 Cellulitis of unspecified part of limb: Secondary | ICD-10-CM

## 2012-09-28 DIAGNOSIS — L03115 Cellulitis of right lower limb: Secondary | ICD-10-CM

## 2012-09-28 DIAGNOSIS — L02419 Cutaneous abscess of limb, unspecified: Secondary | ICD-10-CM

## 2012-09-28 MED ORDER — CEPHALEXIN 500 MG PO CAPS: 500.0000 mg | ORAL_CAPSULE | Freq: Three times a day (TID) | ORAL | Status: DC

## 2012-09-28 NOTE — Progress Notes (Signed)
Chief Complaint  Patient presents with  . Acute Visit    last Tuesday 09/20/12 cut her leg on a rock, has been swollen and feels like it may be infected.    Scraped her right shin on a rock at [employer] 5/27.  She has been using neosporin, and used a clear bandage.  Monday morning she noticed right ankle swelling, and some pain in her lower shin, down to the ankle.  The actual cut has been doing okay, not draining, not painful, not itchy, but the area below the wound on the leg, and at the medial ankle,  is where her pain and swelling and slight itching has been.  Yesterday the lower leg felt warm, and seemed slightly red.  Seems better today.  Immunization History  Administered Date(s) Administered  . Influenza Split 04/13/2011  . Tdap 04/13/2011    Past Medical History  Diagnosis Date  . Allergy     seasonal and dust  . Herpes simplex labialis   . Mixed hyperlipidemia   . Other abnormal blood chemistry    Past Surgical History  Procedure Laterality Date  . Cesarean section    . Tonsillectomy  age 88  . Refractive surgery     History   Social History  . Marital Status: Married    Spouse Name: N/A    Number of Children: 2  . Years of Education: N/A   Occupational History  . 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 Smoking status: Never Smoker   . Smokeless tobacco: Never Used  . Alcohol Use: Yes     Comment: less than one drink per week.  . Drug Use: No  . Sexually Active: Yes -- Female partner(s)     Comment: husband had vasectomy   Other Topics Concern  . Not on file   Social History Narrative   Lives with 2 sons, husband and 3 dogs   Current Outpatient Prescriptions on File Prior to Visit  Medication Sig Dispense Refill  . Multiple Vitamins-Minerals (MULTIVITAMIN WITH MINERALS) tablet Take 1 tablet by mouth daily.        . naproxen sodium (ANAPROX) 220 MG tablet Take 440 mg by mouth 2 (two) times daily with a meal.        . valACYclovir  (VALTREX) 1000 MG tablet Take 2 tablets at onset of cold sore.  Repeat dose of additional 2 tablets in 12 hours (4 tablets per episode)  30 tablet  1   No current facility-administered medications on file prior to visit.   No Known Allergies  ROS:  Denies fevers, nausea, vomiting, bleeding/bruising, other skin rashes.  Denies headaches, chest pain, shortness of breath  PHYSICAL EXAM: BP 120/86  Pulse 84  Temp(Src) 98.9 F (37.2 C) (Oral)  Ht 5\' 5"  (1.651 m)  Wt 207 lb (93.895 kg)  BMI 34.45 kg/m2  LMP 09/07/2012 Pleasant obese female in no distress  Right anterior shin with vertical 3.8 cm scabbed superficial laceration.  Minimal redness surrounding the actual wound, but there is vague erythema and slight warmth extending inferiorly, and extending to medial ankle.  2+ pulses, normal sensation.  No cords, edema, or other abnormality noted.  ASSESSMENT/PLAN:  Cellulitis of leg, right - Plan: cephALEXin (KEFLEX) 500 MG capsule   Cellulitis--area marked with pen.  Treat with keflex, expect improvement in 24-48 hours.  Call or return if increasing redness, warmth, fevers, vomiting, or if not completely resolving.

## 2012-09-28 NOTE — Patient Instructions (Addendum)
  Cellulitis--area marked with pen.  Treat with keflex, expect improvement in 24-48 hours.  Call or return if increasing redness, warmth, fevers, vomiting, or if not completely resolving.  Cellulitis Cellulitis is an infection of the skin and the tissue beneath it. The infected area is usually red and tender. Cellulitis occurs most often in the arms and lower legs.  CAUSES  Cellulitis is caused by bacteria that enter the skin through cracks or cuts in the skin. The most common types of bacteria that cause cellulitis are Staphylococcus and Streptococcus. SYMPTOMS   Redness and warmth.  Swelling.  Tenderness or pain.  Fever. DIAGNOSIS  Your caregiver can usually determine what is wrong based on a physical exam. Blood tests may also be done. TREATMENT  Treatment usually involves taking an antibiotic medicine. HOME CARE INSTRUCTIONS   Take your antibiotics as directed. Finish them even if you start to feel better.  Keep the infected arm or leg elevated to reduce swelling.  Apply a warm cloth to the affected area up to 4 times per day to relieve pain.  Only take over-the-counter or prescription medicines for pain, discomfort, or fever as directed by your caregiver.  Keep all follow-up appointments as directed by your caregiver. SEEK MEDICAL CARE IF:   You notice red streaks coming from the infected area.  Your red area gets larger or turns dark in color.  Your bone or joint underneath the infected area becomes painful after the skin has healed.  Your infection returns in the same area or another area.  You notice a swollen bump in the infected area.  You develop new symptoms. SEEK IMMEDIATE MEDICAL CARE IF:   You have a fever.  You feel very sleepy.  You develop vomiting or diarrhea.  You have a general ill feeling (malaise) with muscle aches and pains. MAKE SURE YOU:   Understand these instructions.  Will watch your condition.  Will get help right away if you are  not doing well or get worse. Document Released: 01/21/2005 Document Revised: 10/13/2011 Document Reviewed: 06/29/2011 [facility] Patient Information 2014 [place], [place].

## 2012-10-12 ENCOUNTER — Encounter: Payer: Self-pay | Admitting: Family Medicine

## 2012-10-12 ENCOUNTER — Ambulatory Visit (INDEPENDENT_AMBULATORY_CARE_PROVIDER_SITE_OTHER): Payer: 59 | Admitting: Family Medicine

## 2012-10-12 DIAGNOSIS — H6590 Unspecified nonsuppurative otitis media, unspecified ear: Secondary | ICD-10-CM

## 2012-10-12 MED ORDER — AZITHROMYCIN 250 MG PO TABS: ORAL_TABLET | ORAL | Status: DC

## 2012-10-12 NOTE — Patient Instructions (Addendum)
Acute serous otitis R ear.  No evidence of bacterial infection at this time.  Supportive measures with decongestants and pain relievers/anti-inflammatory.  Possibly has some musculoskeletal component given the location of some of the pain (into parietal region; nontender now).  Start z-pak if symptoms persist/worsen.

## 2012-10-12 NOTE — Progress Notes (Signed)
Chief Complaint  Patient presents with  . Otalgia    right ear pain x 1 week.   Slight throat irritation, but otherwise not complaining of sinus pain, nasal congestion.  Only slight cough.  Started having right ear pain 1 week ago.  Got better after using a heating pad, but then started back with twinges in the ear, and then yesterday the pain was severe.  Last week the ear hurt when she swallowed, but yesterday it was more throbbing and sharp, sometimes radiating into her scalp.  Ibuprofen helped temporarily. Didn't get benefit from heating pad this time.  Thought she might have had a lowgrade fever.  Father recently passed away, complications of liver failure (related to meds for RA).  She had been staying with her mother over the last week. Didn't check her temp while there.  Recently seen with cellulitis on leg, which she states is practically healed--redness and pain resolved, wound almost gone.  Past Medical History  Diagnosis Date  . Allergy     seasonal and dust  . Herpes simplex labialis   . Mixed hyperlipidemia   . Other abnormal blood chemistry    Past Surgical History  Procedure Laterality Date  . Cesarean section    . Tonsillectomy  age 26  . Refractive surgery     History   Social History  . Marital Status: Married    Spouse Name: N/A    Number of Children: 2  . Years of Education: N/A   Occupational History  . 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 Smoking status: Never Smoker   . Smokeless tobacco: Never Used  . Alcohol Use: Yes     Comment: less than one drink per week.  . Drug Use: No  . Sexually Active: Yes -- Female partner(s)     Comment: husband had vasectomy   Other Topics Concern  . Not on file   Social History Narrative   Lives with 2 sons, husband and 3 dogs   Family History  Problem Relation Age of Onset  . Hyperlipidemia Mother   . Diabetes Mother   . Melanoma Mother   . Arthritis Mother   . Arthritis Father    rheumatoid  . Diabetes Father   . Heart disease Father 31    MI, s/p CABG  . Hypertension Father   . Hyperlipidemia Father   . Atrial fibrillation Father   . Diabetes Brother     resolved after gastric bypass/weight loss   . Hypertension Brother     off meds s/p weight loss  . Cancer Paternal Grandmother     breast cancer in 1's  . Heart disease Maternal Grandmother     22 or younger?  . Stroke Maternal Grandmother     48's  . Cancer Paternal Aunt     breast cancer in 2 paternal aunts   Current Outpatient Prescriptions on File Prior to Visit  Medication Sig Dispense Refill  . naproxen sodium (ANAPROX) 220 MG tablet Take 440 mg by mouth 2 (two) times daily with a meal.        . Multiple Vitamins-Minerals (MULTIVITAMIN WITH MINERALS) tablet Take 1 tablet by mouth daily.        . valACYclovir (VALTREX) 1000 MG tablet Take 2 tablets at onset of cold sore.  Repeat dose of additional 2 tablets in 12 hours (4 tablets per episode)  30 tablet  1   No current facility-administered medications on  file prior to visit.   No Known Allergies  PHYSICAL EXAM: BP 112/72  Pulse 84  Temp(Src) 98.3 F (36.8 C) (Oral)  Ht 5\' 5"  (1.651 m)  Wt 208 lb (94.348 kg)  BMI 34.61 kg/m2  LMP 10/08/2012 Well developed, pleasant female, in no distress.  Calm in discussing the loss of her father, no crying HEENT:  PERRL, EOMI, conjunctiva clear.  TM and EAC on left are normal.  On right, there is fluid behind TM, bubble seen, diffuse light reflex. No erythema.  EAC normal.  Nasal mucosa normal.  OP clear.  Sinuses nontender.  No tenderness at temporalis muscle or artery, nontender at mastoid and SCM. Neck: no lymphadenopathy or mass, nontender Heart: regular rate and rhythm without murmur Lungs: clear bilaterally Psych: normal mood, affect, hygiene and grooming  ASSESSMENT/PLAN:  Serous otitis media, right - Plan: azithromycin (ZITHROMAX) 250 MG tablet  Acute serous otitis R ear.  No evidence of  bacterial infection at this time.  Supportive measures with decongestants and pain relievers/anti-inflammatory.  Possibly has some musculoskeletal component given the location of some of the pain (into parietal region; nontender now).  Start z-pak if symptoms persist/worsen.

## 2013-02-06 ENCOUNTER — Other Ambulatory Visit (INDEPENDENT_AMBULATORY_CARE_PROVIDER_SITE_OTHER): Payer: 59

## 2013-02-06 DIAGNOSIS — Z23 Encounter for immunization: Secondary | ICD-10-CM

## 2013-03-06 ENCOUNTER — Telehealth: Payer: Self-pay | Admitting: Medical

## 2013-03-06 NOTE — Telephone Encounter (Signed)
Was this msg suppose to go to Dr. Lynelle Doctor her PCM?   Per chart, she was last seen 09/2012!  Probably needs an OV

## 2013-03-06 NOTE — Telephone Encounter (Signed)
Please call she thought she was getting better but Thursday got bad again   Chest and chest cong thick mucous

## 2013-03-07 ENCOUNTER — Ambulatory Visit (INDEPENDENT_AMBULATORY_CARE_PROVIDER_SITE_OTHER): Payer: 59 | Admitting: Medical

## 2013-03-07 ENCOUNTER — Encounter: Payer: Self-pay | Admitting: Medical

## 2013-03-07 VITALS — BP 122/80 | HR 92 | Temp 98.4°F | Resp 16 | Wt 211.0 lb

## 2013-03-07 DIAGNOSIS — J40 Bronchitis, not specified as acute or chronic: Secondary | ICD-10-CM

## 2013-03-07 DIAGNOSIS — J329 Chronic sinusitis, unspecified: Secondary | ICD-10-CM

## 2013-03-07 MED ORDER — AMOXICILLIN 875 MG PO TABS: 875.0000 mg | ORAL_TABLET | Freq: Two times a day (BID) | ORAL | Status: DC

## 2013-03-07 NOTE — Progress Notes (Signed)
Subjective:  Stacy Boyer is a 47 y.o. female who presents for congestion.  She notes that she has been sick for a month.  Saw me for a cold a few weeks ago.  Was having coughing, head congestion.  That got better, then this past Thursday got chest congestion, sinus congestion.   Current symptoms include cough, productive of brown beige mucous, green nasal discharge and congestion, some ear fullness.   Denies SOB, wheezing, fever, chest pain, NVD, teeth pain, no sore throat.  Gets burning in chest with cough.  Treatment to date: Mucinex, Nyquil, Dayquil.  No sick contacts.  No other aggravating or relieving factors.  No other c/o.  The following portions of the patient's history were reviewed and updated as appropriate: allergies, current medications, past family history, past medical history, past social history, past surgical history and problem list.  ROS as in subjective  Past Medical History  Diagnosis Date  . Allergy     seasonal and dust  . Herpes simplex labialis   . Mixed hyperlipidemia   . Other abnormal blood chemistry      Objective: Vital signs reviewed  General appearance: Alert, WD/WN, no distress, somewhat ill appearing                             Skin: warm, no rash, no diaphoresis                           Head: no sinus tenderness                            Eyes: conjunctiva normal, corneas clear, PERRLA                            Ears: serous effusions of both TMs, external ear canals normal                          Nose: septum midline, turbinates swollen, with mild erythema and no discharge             Mouth/throat: MMM, tongue normal, mild pharyngeal erythema                           Neck: supple, no adenopathy, no thyromegaly, nontender                          Heart: RRR, normal S1, S2, no murmurs                         Lungs: relatively clear, no rhonchi, no wheezes, no rales                Extremities: no edema, nontender     Assessment: Encounter  Diagnosis  Name Primary?  . Sinobronchitis Yes    Plan:  Medication orders today include: Amoxicillin.  Can c/t OTC remedies for cough and congestion.  Tylenol or Ibuprofen OTC for fever and malaise.  Call/return in 2-3 days if symptoms are worse or not improving.

## 2013-03-07 NOTE — Telephone Encounter (Signed)
When pt called she stated she normally sees Dr. Lynelle Doctor but had recently saw the PA. I did not read the chart I just went on what she was stating ans sent message to [facility].  Left message for pt to call for an appointment

## 2013-03-29 ENCOUNTER — Ambulatory Visit (INDEPENDENT_AMBULATORY_CARE_PROVIDER_SITE_OTHER): Payer: 59 | Admitting: Family Medicine

## 2013-03-29 ENCOUNTER — Encounter: Payer: Self-pay | Admitting: Family Medicine

## 2013-03-29 VITALS — BP 118/84 | HR 88 | Temp 98.4°F | Ht 65.0 in | Wt 210.0 lb

## 2013-03-29 DIAGNOSIS — R05 Cough: Secondary | ICD-10-CM

## 2013-03-29 DIAGNOSIS — R059 Cough, unspecified: Secondary | ICD-10-CM

## 2013-03-29 DIAGNOSIS — J329 Chronic sinusitis, unspecified: Secondary | ICD-10-CM

## 2013-03-29 MED ORDER — AMOXICILLIN-POT CLAVULANATE 875-125 MG PO TABS: 1.0000 | ORAL_TABLET | Freq: Two times a day (BID) | ORAL | Status: DC

## 2013-03-29 MED ORDER — BENZONATATE 200 MG PO CAPS: 200.0000 mg | ORAL_CAPSULE | Freq: Three times a day (TID) | ORAL | Status: DC | PRN

## 2013-03-29 NOTE — Patient Instructions (Signed)
Continue Mucinex (plain or DM) Drink plenty of fluids Try sinus rinses up to twice daily Add claritin or zyrtec once daily Refill tessalon Take augmentin twice daily for 14 days

## 2013-03-29 NOTE — Progress Notes (Signed)
Chief Complaint  Patient presents with  . Cough    produces a brown colored mucus that she is able to get up sometimes. Saw Shane 03/07/13 and just really never got better.    She was treated with amoxacillin back on 11/11 for URI/sinus infection.  Symptoms then included sinus pressure and burning in chest and sinuses.  She has been busy, and can't really recall if antibiotics helped. She never got 100% better, possibly got better during the course, but has gotten worse since being off of the medication.  Maybe got 50% better.  She has been using Mucinex--plain during the day, and DM at night, and a multi-symptom flu medication (like theraflu) if she didn't need to do anything during the day.  She also got some sort of cough syrup without a prescription from [facility]--small bottle that she needed to sign for.  She thought it had codeine, and took it only if she woke up in the middle of the night coughing.  +frontal headaches, and sometimes behind her eyes/cheeks (mainly burning in that area)  Denies shortness of breath, but does describe a mild tightness in her chest.  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 Phlegm is small amounts, "bits and pieces", thick, and greenish-brown in the morning, only occasionally during the day.  She previously took benzonatate and is asking for a refill.  Past Medical History  Diagnosis Date  . Allergy     seasonal and dust  . Herpes simplex labialis   . Mixed hyperlipidemia   . Other abnormal blood chemistry    Past Surgical History  Procedure Laterality Date  . Cesarean section    . Tonsillectomy  age 32  . Refractive surgery     History   Social History  . Marital Status: Married    Spouse Name: N/A    Number of Children: 2  . Years of Education: N/A   Occupational History  . 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 Smoking status: Never Smoker   . Smokeless tobacco: Never Used  . Alcohol  Use: Yes     Comment: less than one drink per week.  . Drug Use: No  . Sexual Activity: Yes    Partners: Male     Comment: husband had vasectomy   Other Topics Concern  . Not on file   Social History Narrative   Lives with 2 sons, husband and 3 dogs    Current outpatient prescriptions:Multiple Vitamins-Minerals (MULTIVITAMIN WITH MINERALS) tablet, Take 1 tablet by mouth daily.  , Disp: , Rfl: ;  naproxen sodium (ANAPROX) 220 MG tablet, Take 220 mg by mouth daily., Disp: , Rfl: ;  valACYclovir (VALTREX) 1000 MG tablet, Take 2 tablets at onset of cold sore.  Repeat dose of additional 2 tablets in 12 hours (4 tablets per episode), Disp: 30 tablet, Rfl: 1  No Known Allergies  ROS:  Denies fevers, chills, nausea, vomiting, diarrhea, skin rashes, bleeding, bruising. No urinary complaints, dizziness, chest pain, palpitations or shortness of breath (see HPI).  PHYSICAL EXAM: BP 118/84  Pulse 88  Temp(Src) 98.4 F (36.9 C) (Oral)  Ht 5\' 5"  (1.651 m)  Wt 210 lb (95.255 kg)  BMI 34.95 kg/m2  Well developed, pleasant female, speaking easily in full sentences.  Occasionally with a dry-sounding cough HEENT:  PERRL, EOMI, conjunctiva clear.  Nasal mucosa mildly edematous, pretty pale, with clear mucus on the left.  TM's are  retracted bilaterally, no erythema.  EAC's normal.  OP clear, mucus membranes moist. Sinuses nontender Neck: no lymphadenopathy, thyromegaly or mass Heart: regular rate and rhythm without murmur Lungs: clear bilaterally, no wheeze with forced expiration Skin: no rash Psych: normal mood, affect Neuro: alert, oriented, cranial nerves intact   ASSESSMENT/PLAN  Cough - Plan: benzonatate (TESSALON) 200 MG capsule  Sinusitis - Plan: amoxicillin-clavulanate (AUGMENTIN) 875-125 MG per tablet   Sinusitis, only partially/inadequately treated with last course of amoxacillin--this is based on history.  Exam doesn't show clear evidence of bacterial sinusitis.  Cannot rule out  component of allergies.  Continue Mucinex (plain or DM) Drink plenty of fluids Try sinus rinses up to twice daily Add claritin or zyrtec once daily Refill tessalon rx augmentin x 14 days  Risks/side effects of meds reviewed, and OTC meds reviewed.

## 2013-04-26 ENCOUNTER — Ambulatory Visit (INDEPENDENT_AMBULATORY_CARE_PROVIDER_SITE_OTHER): Payer: 59 | Admitting: Family Medicine

## 2013-04-26 ENCOUNTER — Encounter: Payer: Self-pay | Admitting: Family Medicine

## 2013-04-26 VITALS — BP 118/80 | HR 64 | Temp 99.1°F | Wt 213.0 lb

## 2013-04-26 DIAGNOSIS — R059 Cough, unspecified: Secondary | ICD-10-CM

## 2013-04-26 DIAGNOSIS — J111 Influenza due to unidentified influenza virus with other respiratory manifestations: Secondary | ICD-10-CM

## 2013-04-26 DIAGNOSIS — R52 Pain, unspecified: Secondary | ICD-10-CM

## 2013-04-26 DIAGNOSIS — R05 Cough: Secondary | ICD-10-CM

## 2013-04-26 DIAGNOSIS — R509 Fever, unspecified: Secondary | ICD-10-CM

## 2013-04-26 MED ORDER — OSELTAMIVIR PHOSPHATE 75 MG PO CAPS: 75.0000 mg | ORAL_CAPSULE | Freq: Two times a day (BID) | ORAL | Status: DC

## 2013-04-26 MED ORDER — BENZONATATE 200 MG PO CAPS: 200.0000 mg | ORAL_CAPSULE | Freq: Three times a day (TID) | ORAL | Status: DC | PRN

## 2013-04-26 NOTE — Patient Instructions (Addendum)
Continue with fever control (either naproxen OR ibuprofen, along with acetaminophen, if needed) Continue with tessalon as needed for cough. Use Mucinex (plain or DM) as needed, and decongestant (ie sudafed) as needed for congestion and sinus pain  Influenza, Adult Influenza ("the flu") is a viral infection of the respiratory tract. It occurs more often in winter months because people spend more time in close contact with one another. Influenza can make you feel very sick. Influenza easily spreads from person to person (contagious). CAUSES  Influenza is caused by a virus that infects the respiratory tract. You can catch the virus by breathing in droplets from an infected person's cough or sneeze. You can also catch the virus by touching something that was recently contaminated with the virus and then touching your mouth, nose, or eyes. SYMPTOMS  Symptoms typically last 4 to 10 days and may include:  Fever.  Chills.  Headache, body aches, and muscle aches.  Sore throat.  Chest discomfort and cough.  Poor appetite.  Weakness or feeling tired.  Dizziness.  Nausea or vomiting. DIAGNOSIS  Diagnosis of influenza is often made based on your history and a physical exam. A nose or throat swab test can be done to confirm the diagnosis. RISKS AND COMPLICATIONS You may be at risk for a more severe case of influenza if you smoke cigarettes, have diabetes, have chronic heart disease (such as heart failure) or lung disease (such as asthma), or if you have a weakened immune system. Elderly people and pregnant women are also at risk for more serious infections. The most common complication of influenza is a lung infection (pneumonia). Sometimes, this complication can require emergency medical care and may be life-threatening. PREVENTION  An annual influenza vaccination (flu shot) is the best way to avoid getting influenza. An annual flu shot is now routinely recommended for all adults in the  [place] TREATMENT  In mild cases, influenza goes away on its own. Treatment is directed at relieving symptoms. For more severe cases, your caregiver may prescribe antiviral medicines to shorten the sickness. Antibiotic medicines are not effective, because the infection is caused by a virus, not by bacteria. HOME CARE INSTRUCTIONS  Only take over-the-counter or prescription medicines for pain, discomfort, or fever as directed by your caregiver.  Use a cool mist humidifier to make breathing easier.  Get plenty of rest until your temperature returns to normal. This usually takes 3 to 4 days.  Drink enough fluids to keep your urine clear or pale yellow.  Cover your mouth and nose when coughing or sneezing, and wash your hands well to avoid spreading the virus.  Stay home from work or school until your fever has been gone for at least 1 full day. SEEK MEDICAL CARE IF:   You have chest pain or a deep cough that worsens or produces more mucus.  You have nausea, vomiting, or diarrhea. SEEK IMMEDIATE MEDICAL CARE IF:   You have difficulty breathing, shortness of breath, or your skin or nails turn bluish.  You have severe neck pain or stiffness.  You have a severe headache, facial pain, or earache.  You have a worsening or recurring fever.  You have nausea or vomiting that cannot be controlled. MAKE SURE YOU:  Understand these instructions.  Will watch your condition.  Will get help right away if you are not doing well or get worse. Document Released: 04/10/2000 Document Revised: 10/13/2011 Document Reviewed: 07/13/2011 [facility] Patient Information 2014 [place], [place].

## 2013-04-26 NOTE — Progress Notes (Signed)
Chief Complaint  Patient presents with  . cough    cough has returned, low grade fever. body aches, possible flu. family member was tested positive for the flu    Patient had completely recovered from the sinus infection earlier this month.  She was exposed to her mother and aunt who have been sick with influenza.  She started with a cough 2 days ago.  Body aches started last night.  She has been having low grade fevers, but has been taking meds which lower temp.  This morning she hasn't taken any medications yet.  Mucus from nose is clear.  She is having sinus pressure, sore throat, "everything hurts".  Naproxen helps a little with the pain.  She is having a lot of dry cough, and feels slightly short of breath.  Past Medical History  Diagnosis Date  . Allergy     seasonal and dust  . Herpes simplex labialis   . Mixed hyperlipidemia   . Other abnormal blood chemistry    Past Surgical History  Procedure Laterality Date  . Cesarean section    . Tonsillectomy  age 46  . Refractive surgery     History   Social History  . Marital Status: Married    Spouse Name: N/A    Number of Children: 2  . Years of Education: N/A   Occupational History  . 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 Smoking status: Never Smoker   . Smokeless tobacco: Never Used  . Alcohol Use: Yes     Comment: less than one drink per week.  . Drug Use: No  . Sexual Activity: Yes    Partners: Male     Comment: husband had vasectomy   Other Topics Concern  . Not on file   Social History Narrative   Lives with 2 sons, husband and 3 dogs    Current outpatient prescriptions:benzonatate (TESSALON) 200 MG capsule, Take 1 capsule (200 mg total) by mouth 3 (three) times daily as needed for cough., Disp: 20 capsule, Rfl: 0;  Multiple Vitamins-Minerals (MULTIVITAMIN WITH MINERALS) tablet, Take 1 tablet by mouth daily.  , Disp: , Rfl: ;  naproxen sodium (ANAPROX) 220 MG tablet, Take 220 mg by mouth  daily., Disp: , Rfl:  valACYclovir (VALTREX) 1000 MG tablet, Take 2 tablets at onset of cold sore.  Repeat dose of additional 2 tablets in 12 hours (4 tablets per episode), Disp: 30 tablet, Rfl: 1  No Known Allergies  ROS:  +mild headache; no dizziness or fainting.  Some chest pain from coughing, burning sensation after coughing spell.  Denies any nausea, vomiting, diarrhea.  No rashes, bleeding, bruising.  See HPI  PHYSICAL EXAM:  BP 118/80  Pulse 64  Temp(Src) 99.1 F (37.3 C) (Oral)  Wt 213 lb (96.616 kg) Mildly ill-appearing female with frequent dry cough.  Speaking easily in full sentences HEENT:  PERRL, EOMI, conjunctiva clear. TM's and EAC's normal. Nasal mucosa is normal.  Sinuses nontender.  OP is clear Neck: shotty anterior cervical lymphadenopathy Heart: regular rate and rhythm without murmur Lungs: clear bilaterally Skin: no rash  Influenza tests were negative  ASSESSMENT/PLAN:  Fever, unspecified - Plan: POC Influenza A&B (Binax test), CANCELED: Influenza A/B  Body aches - Plan: POC Influenza A&B (Binax test), CANCELED: Influenza A/B  Influenza - negative POC test, but +exposure and consistent symptoms.  symptoms <48 hrs, so treat with Tamiflu - Plan: oseltamivir (TAMIFLU) 75 MG capsule, POC Influenza A&B (  Binax test)  Cough - Plan: benzonatate (TESSALON) 200 MG capsule, POC Influenza A&B (Binax test)  Risks/side effects of meds reviewed.  Return if symptoms persist/worsen

## 2013-04-27 HISTORY — PX: DILATION AND CURETTAGE OF UTERUS: SHX78

## 2013-04-28 ENCOUNTER — Telehealth: Payer: Self-pay | Admitting: *Deleted

## 2013-04-28 NOTE — Telephone Encounter (Signed)
Pt just called back and stated that her husband is leaving work for symptoms. His names is kingslee dowse dob 06/05/1962

## 2013-04-28 NOTE — Telephone Encounter (Signed)
I said they should contact their doctors.  I do not believe I am the PCP for anyone else in her family, never met them.  Per computer, it looks like Audelia Acton is, and he is swamped, so I will try and address, but this should have been put as message under these patients names, not Debralee's.  Please have nurse call and find out what specific symptoms pts are having (to verify that it sounds like influenza,with fever, body aches--ensure no shortness of breath, chest pain, or GI symptoms which would speak against flu). If consistent with influenza, okay for tamiflu 35m BID x 5 days, #10 to be sent (for both of these family members, entered as phone encounters under their charts).  Also, this encounter lists CWallie Renshawas provider, rather than ERita Ohara please correct.  Thanks

## 2013-04-28 NOTE — Telephone Encounter (Signed)
This information was rerouted per instructions

## 2013-04-28 NOTE — Telephone Encounter (Signed)
Pt states that she came in on Wednesday and has the flu. She states she was informed that if anyone in her family stated having symptoms she was to call back. She states that her son, Jericka Kadar dob-10/04/1996 is now having symptoms. She states that she was told Tamiflu would be called in for that member. Pt uses [facility] on [address].

## 2013-12-08 ENCOUNTER — Telehealth: Payer: Self-pay | Admitting: Family Medicine

## 2013-12-08 ENCOUNTER — Encounter: Payer: Self-pay | Admitting: Family Medicine

## 2013-12-08 NOTE — Telephone Encounter (Signed)
MyChart message was sent with recommendations and advice.  If I see that she hasn't read the message by Monday, I'll have [place] call her back

## 2013-12-08 NOTE — Telephone Encounter (Signed)
She needs to schedule OV.  She is past due for CPE (last was 05/2012) but she doesn't need to wait for a CPE to discuss this problem, she can schedule OV (AND get on the books for a CPE), OR she can schedule an OV with GYN.  I don't believe she has a GYN, as I did her pelvic exam in 2014.  So, please schedule OV--will need labs, possibly pelvic.  The labs needed to evaluate this problem don't need to be fasting, but if she wants to come fasting we can do the rest of the recommend routine screening labs (glucose for diabetes, and lipids, which don't pertain to this problem, but would be done at a physical)

## 2013-12-08 NOTE — Telephone Encounter (Signed)
Called pt and gave her Dr Johnsie Kindred advise. Pt requesting a recommendation from Dr Tomi Bamberger for an OBGYN that she can go to with her issues and pt is aware that she need to schedule a CPE with Dr Tomi Bamberger.

## 2013-12-11 ENCOUNTER — Other Ambulatory Visit: Payer: Self-pay | Admitting: *Deleted

## 2013-12-11 DIAGNOSIS — N939 Abnormal uterine and vaginal bleeding, unspecified: Secondary | ICD-10-CM

## 2013-12-29 ENCOUNTER — Ambulatory Visit (INDEPENDENT_AMBULATORY_CARE_PROVIDER_SITE_OTHER): Payer: 59 | Admitting: Obstetrics and Gynecology

## 2013-12-29 ENCOUNTER — Telehealth: Payer: Self-pay | Admitting: Obstetrics and Gynecology

## 2013-12-29 ENCOUNTER — Encounter: Payer: Self-pay | Admitting: Obstetrics and Gynecology

## 2013-12-29 VITALS — BP 122/66 | HR 92 | Temp 99.0°F | Resp 20 | Ht 65.5 in | Wt 213.0 lb

## 2013-12-29 DIAGNOSIS — N920 Excessive and frequent menstruation with regular cycle: Secondary | ICD-10-CM

## 2013-12-29 DIAGNOSIS — N926 Irregular menstruation, unspecified: Secondary | ICD-10-CM

## 2013-12-29 DIAGNOSIS — N921 Excessive and frequent menstruation with irregular cycle: Secondary | ICD-10-CM

## 2013-12-29 LAB — POCT URINALYSIS DIPSTICK
Bilirubin, UA: NEGATIVE
GLUCOSE UA: NEGATIVE
Ketones, UA: NEGATIVE
Leukocytes, UA: NEGATIVE
NITRITE UA: NEGATIVE
PH UA: 5
Protein, UA: NEGATIVE
UROBILINOGEN UA: NEGATIVE

## 2013-12-29 LAB — CBC
HEMATOCRIT: 40.9 % (ref 36.0–46.0)
HEMOGLOBIN: 14.1 g/dL (ref 12.0–15.0)
MCH: 29.3 pg (ref 26.0–34.0)
MCHC: 34.5 g/dL (ref 30.0–36.0)
MCV: 85 fL (ref 78.0–100.0)
Platelets: 269 10*3/uL (ref 150–400)
RBC: 4.81 MIL/uL (ref 3.87–5.11)
RDW: 14.2 % (ref 11.5–15.5)
WBC: 7.2 10*3/uL (ref 4.0–10.5)

## 2013-12-29 LAB — COMPREHENSIVE METABOLIC PANEL
ALBUMIN: 4.3 g/dL (ref 3.5–5.2)
ALT: 31 U/L (ref 0–35)
AST: 31 U/L (ref 0–37)
Alkaline Phosphatase: 94 U/L (ref 39–117)
BUN: 13 mg/dL (ref 6–23)
CALCIUM: 9.8 mg/dL (ref 8.4–10.5)
CHLORIDE: 101 meq/L (ref 96–112)
CO2: 26 mEq/L (ref 19–32)
CREATININE: 1 mg/dL (ref 0.50–1.10)
GLUCOSE: 91 mg/dL (ref 70–99)
POTASSIUM: 4.3 meq/L (ref 3.5–5.3)
Sodium: 137 mEq/L (ref 135–145)
Total Bilirubin: 0.5 mg/dL (ref 0.2–1.2)
Total Protein: 7.4 g/dL (ref 6.0–8.3)

## 2013-12-29 LAB — POCT URINE PREGNANCY: Preg Test, Ur: NEGATIVE

## 2013-12-29 LAB — HEMOGLOBIN, FINGERSTICK: HEMOGLOBIN, FINGERSTICK: 14.5 g/dL (ref 12.0–16.0)

## 2013-12-29 LAB — TSH: TSH: 4.114 u[IU]/mL (ref 0.350–4.500)

## 2013-12-29 NOTE — Patient Instructions (Signed)
Endometrial Ablation Endometrial ablation removes the lining of the uterus (endometrium). It is usually a same-day, outpatient treatment. Ablation helps avoid major surgery, such as surgery to remove the cervix and uterus (hysterectomy). After endometrial ablation, you will have little or no menstrual bleeding and may not be able to have children. However, if you are premenopausal, you will need to use a reliable method of birth control following the procedure because of the small chance that pregnancy can occur. There are different reasons to have this procedure, which include:  Heavy periods.  Bleeding that is causing anemia.  Irregular bleeding.  Bleeding fibroids on the lining inside the uterus if they are smaller than 3 centimeters. This procedure should not be done if:  You want children in the future.  You have severe cramps with your menstrual period.  You have precancerous or cancerous cells in your uterus.  You were recently pregnant.  You have gone through menopause.  You have had major surgery on the uterus, such as a cesarean delivery. LET YOUR HEALTH CARE PROVIDER KNOW ABOUT:  Any allergies you have.  All medicines you are taking, including vitamins, herbs, eye drops, creams, and over-the-counter medicines.  Previous problems you or members of your family have had with the use of anesthetics.  Any blood disorders you have.  Previous surgeries you have had.  Medical conditions you have. RISKS AND COMPLICATIONS  Generally, this is a safe procedure. However, as with any procedure, complications can occur. Possible complications include:  Perforation of the uterus.  Bleeding.  Infection of the uterus, bladder, or vagina.  Injury to surrounding organs.  An air bubble to the lung (air embolus).  Pregnancy following the procedure.  Failure of the procedure to help the problem, requiring hysterectomy.  Decreased ability to diagnose cancer in the lining of  the uterus. BEFORE THE PROCEDURE  The lining of the uterus must be tested to make sure there is no pre-cancerous or cancer cells present.  An ultrasound may be performed to look at the size of the uterus and to check for abnormalities.  Medicines may be given to thin the lining of the uterus. PROCEDURE  During the procedure, your health care provider will use a tool called a resectoscope to help see inside your uterus. There are different ways to remove the lining of your uterus.   Radiofrequency - This method uses a radiofrequency-alternating electric current to remove the lining of the uterus.  Cryotherapy - This method uses extreme cold to freeze the lining of the uterus.  Heated-Free Liquid - This method uses heated salt (saline) solution to remove the lining of the uterus.  Microwave - This method uses high-energy microwaves to heat up the lining of the uterus to remove it.  Thermal balloon - This method involves inserting a catheter with a balloon tip into the uterus. The balloon tip is filled with heated fluid to remove the lining of the uterus. AFTER THE PROCEDURE  After your procedure, do not have sexual intercourse or insert anything into your vagina until permitted by your health care provider. After the procedure, you may experience:  Cramps.  Vaginal discharge.  Frequent urination. Document Released: 02/21/2004 Document Revised: 12/14/2012 Document Reviewed: 09/14/2012 [employer] Patient Information 2015 [employer]. This information is not intended to replace advice given to you by your health care provider. Make sure you discuss any questions you have with your health care provider.  

## 2013-12-29 NOTE — Progress Notes (Signed)
GYNECOLOGY VISIT  PCP: Rita Ohara  Referring provider: Rita Ohara  HPI: 48 y.o.   Married  Caucasian  female   [phone] with Patient's last menstrual period was 12/11/2013.   here for   Irregular Bleeding  Menses were normal up until 2 - 3 years ago. Menses are irregular, heavy, clots, and cramping. Wearing pad and tampon with changes up to every 2 hours for 3 - 4 days.  Can skip cycles for a couple of months. Then bleeds for 2 weeks, stops for a week, and then bleed for another week. Rarely skips menses.  No hot flashes but is having night sweats. Feels weak and low energy.   No history of fibroids or endometriosis.  No prior ultrasound.   Uses birth control pills prior to childbearing.   Having some urinary incontinence? Uncertain the source of the fluid in underwear.  Has short episodes of heavy cough.  No leakage with sneeze or exercise.   Some diarrhea and constipation, alternating.  Chronic issue.  Lactose intolerance.   Hgb:  14.5 Urine:  RBC=Mod - not having vagina bleeding according to patient.  No dysuria.  UPT: negative  GYNECOLOGIC HISTORY: Patient's last menstrual period was 12/11/2013. Sexually active: yes  Partner preference: female Contraception:   Husband had vasectomy  Menopausal hormone therapy: N/A DES exposure: No   Blood transfusions: No   Sexually transmitted diseases: No   GYN procedures and prior surgeries: C-Section  Last mammogram:   04/2012 BIRADS1: Neg              Last pap and high risk HPV testing: 03/2011 Normal   History of abnormal pap smear:  No   OB History   Grav Para Term Preterm Abortions TAB SAB Ect Mult Living   3 2   1  1        2  living children  LIFESTYLE: Exercise: No              Tobacco: No Alcohol:<1 weekly Drug use: No   Patient Active Problem List   Diagnosis Date Noted  . Obesity (BMI 30.0-34.9) 06/01/2012  . Irregular menses 06/01/2012  . Elevated LFTs 05/04/2011  . Mixed hyperlipidemia 05/04/2011  .  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 Allergic rhinitis, cause unspecified 04/13/2011    Past Medical History  Diagnosis Date  . Allergy     seasonal and dust  . Herpes simplex labialis   . Mixed hyperlipidemia   . Other abnormal blood chemistry     Past Surgical History  Procedure Laterality Date  . Cesarean section    . Tonsillectomy  age 82  . Refractive surgery      Current Outpatient Prescriptions  Medication Sig Dispense Refill  . cetirizine (ZYRTEC) 10 MG tablet Take 10 mg by mouth daily.      . Multiple Vitamins-Minerals (MULTIVITAMIN WITH MINERALS) tablet Take 1 tablet by mouth daily.        . naproxen sodium (ANAPROX) 220 MG tablet Take 220 mg by mouth daily.      . Omega-3 Fatty Acids (FISH OIL) 500 MG CAPS Take by mouth.      . valACYclovir (VALTREX) 1000 MG tablet Take 2 tablets at onset of cold sore.  Repeat dose of additional 2 tablets in 12 hours (4 tablets per episode)  30 tablet  1   No current facility-administered medications for this visit.     ALLERGIES: Review of patient's allergies indicates no known allergies.  Family History  Problem Relation Age of Onset  . Hyperlipidemia Mother   . Diabetes Mother   . Melanoma Mother   . Arthritis Mother   . Arthritis Father     rheumatoid  . Diabetes Father   . Heart disease Father 16    MI, s/p CABG  . Hypertension Father   . Hyperlipidemia Father   . Atrial fibrillation Father   . Diabetes Brother     resolved after gastric bypass/weight loss   . Hypertension Brother     off meds s/p weight loss  . Cancer Paternal Grandmother     breast cancer in 46's  . Heart disease Maternal Grandmother     58 or younger?  . Stroke Maternal Grandmother     104's  . Cancer Paternal Aunt     breast cancer in 2 paternal aunts    History   Social History  . Marital Status: Married    Spouse Name: N/A    Number of Children: 2  . Years of Education: N/A   Occupational History  . Program Chair at [employer]     Social History Main Topics  . Smoking status: Never Smoker   . Smokeless tobacco: Never Used  . Alcohol Use: 0.5 oz/week    1 drink(s) per week     Comment: less than one drink per week.  . Drug Use: No  . Sexual Activity: Yes    Partners: Male     Comment: husband had vasectomy   Other Topics Concern  . Not on file   Social History Narrative   Lives with 2 sons, husband and 3 dogs    ROS:  Pertinent items are noted in HPI.  PHYSICAL EXAMINATION:    BP 122/66  Pulse 92  Temp(Src) 99 F (37.2 C) (Oral)  Resp 20  Ht 5' 5.5" (1.664 m)  Wt 213 lb (96.616 kg)  BMI 34.89 kg/m2  LMP 12/11/2013   Wt Readings from Last 3 Encounters:  12/29/13 213 lb (96.616 kg)  04/26/13 213 lb (96.616 kg)  03/29/13 210 lb (95.255 kg)     Ht Readings from Last 3 Encounters:  12/29/13 5' 5.5" (1.664 m)  03/29/13 5\' 5"  (1.651 m)  10/12/12 5\' 5"  (1.651 m)    General appearance: alert, cooperative and appears stated age Head: Normocephalic, without obvious abnormality, atraumatic Neck: no adenopathy, supple, symmetrical, trachea midline and thyroid not enlarged, symmetric, no tenderness/mass/nodules Lungs: clear to auscultation bilaterally Breasts: Inspection negative, No nipple retraction or dimpling, No nipple discharge or bleeding, No axillary or supraclavicular adenopathy, Normal to palpation without dominant masses Heart: regular rate and rhythm Abdomen: soft, non-tender; no masses,  no organomegaly Extremities: extremities normal, atraumatic, no cyanosis or edema Skin: Skin color, texture, turgor normal. No rashes or lesions Lymph nodes: Cervical, supraclavicular, and axillary nodes normal. No abnormal inguinal nodes palpated Neurologic: Grossly normal  Pelvic: External genitalia:  no lesions              Urethra:  normal appearing urethra with no masses, tenderness or lesions              Bartholins and Skenes: normal                 Vagina: normal appearing vagina with normal  color and discharge, no lesions, yellowish/brown blood.              Cervix: normal appearance  Bimanual Exam:  Uterus:  uterus is normal size, shape, consistency and nontender                                      Adnexa: normal adnexa in size, nontender and no masses                                      Rectovaginal: Confirms                                      Anus:  normal sphincter tone, no lesions  ASSESSMENT  Menorrhagia. Metrorrhagia. Perimenopausal female.  PLAN  Discussed abnormal uterine bleeding, etiologies and treatments.  Discussed medical therapies - OCPs, Mirena IUD, ablation, hysterectomy.  Interested in potential ablation.  Written information to patient.  TSH, CMP, CBC. Return for pelvic ultrasound, sonohysterogram, EMB.     An After Visit Summary was printed and given to the patient.  45 minutes face to face time of which over 50% was spent in counseling.

## 2013-12-29 NOTE — Telephone Encounter (Signed)
Message copied by Laqueta Due on Fri Dec 29, 2013 12:09 PM ------      Message from: [place], BROOK E      Created: Fri Dec 29, 2013 11:04 AM      Regarding: request for sonohysterogram and EMB for 9/10        Hello,            My new patient would like to proceed with her sonohysterogram and EMB with me for 9/10 if possible.             Thanks,            [place] ------

## 2013-12-29 NOTE — Telephone Encounter (Signed)
Spoke with patient. Advised that per benefit quote received, she will be responsible for $20 copay when she comes in for SHGM/EMB. Patient agreeable. Patient is scheduled for 09.10.2015.

## 2013-12-29 NOTE — Telephone Encounter (Signed)
Thanks.  I will close the encounter.

## 2014-01-04 ENCOUNTER — Ambulatory Visit (INDEPENDENT_AMBULATORY_CARE_PROVIDER_SITE_OTHER): Payer: 59

## 2014-01-04 ENCOUNTER — Other Ambulatory Visit: Payer: Self-pay | Admitting: Obstetrics and Gynecology

## 2014-01-04 ENCOUNTER — Encounter: Payer: Self-pay | Admitting: Obstetrics and Gynecology

## 2014-01-04 ENCOUNTER — Ambulatory Visit (INDEPENDENT_AMBULATORY_CARE_PROVIDER_SITE_OTHER): Payer: 59 | Admitting: Obstetrics and Gynecology

## 2014-01-04 VITALS — BP 130/86 | Resp 18 | Ht 65.5 in | Wt 216.0 lb

## 2014-01-04 DIAGNOSIS — N921 Excessive and frequent menstruation with irregular cycle: Secondary | ICD-10-CM

## 2014-01-04 DIAGNOSIS — N920 Excessive and frequent menstruation with regular cycle: Secondary | ICD-10-CM

## 2014-01-04 DIAGNOSIS — N9489 Other specified conditions associated with female genital organs and menstrual cycle: Secondary | ICD-10-CM

## 2014-01-04 NOTE — Progress Notes (Signed)
Subjective 48 y.o. Married Caucasian female  [id number] with Patient's last menstrual period was 12/11/2013.  Here for ultrasound for heavy and irregular menses.  Interested in potential endometrial ablation.   No family history of reproductive cancers of uterus or ovaries.  Family history of Paternal grandmother and two paternal aunts with breast cancer.   Objective  Pelvic ultrasound - images and report reviewed with patient.   Endometrium 12.97 mm with blood flow to area.  Anterior right fibroid 1.79 mm.  Ovaries normal.  No free fluid.     Procedure - sonohysterogram Consent performed. Speculum placed in vagina. Sterile prep of cervix with betadine. Cannula placed inside endometrial cavity without difficulty. Speculum removed. Sterile saline injected.        23 x 26 mm      filling defect noted. Cannula removed. No complication.   Procedure - endometrial biopsy Consent performed. Speculum place in vagina.  Sterile prep of cervix with betadine.  Pipelle placed to   8.5       cm without difficulty twice. Tissue obtained and sent to pathology. Speculum removed.  No complications.  Assessment  Menometrorrhagia.  Endometrial mass.  Suspect endometrial polyp.   Plan  Follow up endometrial biopsy.  Pending benign pathology, plan will be hysteroscopic polypectomy with Myosure, Novasure endometrial ablation, laparoscopic salpingectomy. I discussed benefits and risks which include but are not limited to bleeding, infection, damage to surrounding organs including uterine perforation, pulmonary edema, hyponatremia, reaction to anesthesia, pneumonia, DVT, PE, death, and need for further surgical procedures.  We discussed surgical expectations and outcome.  I discussed hysterectomy as an alternative to the above procedure, and patient prefers to do a less invasive procedure.   25 minutes face to face time of which over 50% was spent in counseling.   After visit summary to  patient.

## 2014-01-04 NOTE — Patient Instructions (Signed)

## 2014-01-05 ENCOUNTER — Telehealth: Payer: Self-pay | Admitting: Obstetrics and Gynecology

## 2014-01-05 NOTE — Telephone Encounter (Signed)
Spoke with patient. Advised that per benefit quote received, she will be responsible for $300 for the surgeons portion of her surgery. Advised that per office policy, payment is due in full at least 2 weeks prior to scheduled surgery date. Patient agreeable. Patient okay to schedule asap.

## 2014-01-05 NOTE — Telephone Encounter (Signed)
Call to patietn to confirm date availability since requested ASAP. Advised of date options for 01-16-14 or into October. Patient prefers to proceed with 01-16-14, pending biopsy result. Husband with vasectomy.  Will schedule and call back next week.

## 2014-01-08 ENCOUNTER — Encounter (HOSPITAL_COMMUNITY): Payer: Self-pay | Admitting: Pharmacist

## 2014-01-08 LAB — IPS OTHER TISSUE BIOPSY

## 2014-01-08 NOTE — Telephone Encounter (Signed)
Patient returned call, Advised surgery date of 01-16-14 at 58 at [facility]. Surgery instruction sheet reviewed and mailed to patient. Post op appointment scheduled. Call PRN. Case changed to Hysteroscopy/D&C/Myosure with [facility] in [place].  Encounter closed.

## 2014-01-08 NOTE — Telephone Encounter (Signed)
Call to patient to review surgery instructions, LMTCB.

## 2014-01-08 NOTE — Telephone Encounter (Signed)
Phone call to patient  Endometrial biopsy:  Hypo-proliferative endometrium with tubal metaplasia.  No hyperplasia or malignancy.  I discussed with patient that the biopsy does not match the sonohysterogram findings.  I recommend proceeding with hysteroscopic resection of endometrial mass and dilation and curettage and not doing an ablation or tubal ligation at the same time.  This can be done at a future date if desired and appropriate.  Patient agrees.  Questions answered.

## 2014-01-09 ENCOUNTER — Telehealth: Payer: Self-pay | Admitting: Obstetrics and Gynecology

## 2014-01-09 NOTE — Telephone Encounter (Signed)
Patient returned call. Made surgery payment. I mailed patient a copy of the receipt

## 2014-01-09 NOTE — Telephone Encounter (Signed)
Left message for patient to call back. Need to collect surgery payment. Patient responsibility $290.95.

## 2014-01-10 ENCOUNTER — Encounter (HOSPITAL_COMMUNITY): Payer: Self-pay | Admitting: *Deleted

## 2014-01-15 ENCOUNTER — Telehealth: Payer: Self-pay | Admitting: *Deleted

## 2014-01-15 NOTE — Telephone Encounter (Signed)
Call to patient. Advised that surgery time for tomorrow has been moved up to 0730. Need to arrive at 6 am. Patient agreeable.  Routing to provider for final review. Patient agreeable to disposition. Will close encounter

## 2014-01-16 ENCOUNTER — Encounter (HOSPITAL_COMMUNITY): Payer: Self-pay | Admitting: *Deleted

## 2014-01-16 ENCOUNTER — Ambulatory Visit (HOSPITAL_COMMUNITY)
Admission: RE | Admit: 2014-01-16 | Discharge: 2014-01-16 | Disposition: A | Discharge status: Home / Self Care | Payer: 59 | Source: Ambulatory Visit | Attending: Obstetrics and Gynecology | Admitting: Obstetrics and Gynecology

## 2014-01-16 ENCOUNTER — Encounter (HOSPITAL_COMMUNITY)
Admission: RE | Disposition: A | Discharge status: Home / Self Care | Payer: Self-pay | Source: Ambulatory Visit | Attending: Obstetrics and Gynecology

## 2014-01-16 ENCOUNTER — Ambulatory Visit (HOSPITAL_COMMUNITY): Payer: 59 | Admitting: Anesthesiology

## 2014-01-16 ENCOUNTER — Encounter (HOSPITAL_COMMUNITY): Payer: 59 | Admitting: Anesthesiology

## 2014-01-16 DIAGNOSIS — N8501 Benign endometrial hyperplasia: Secondary | ICD-10-CM | POA: Insufficient documentation

## 2014-01-16 DIAGNOSIS — N921 Excessive and frequent menstruation with irregular cycle: Secondary | ICD-10-CM | POA: Insufficient documentation

## 2014-01-16 DIAGNOSIS — N920 Excessive and frequent menstruation with regular cycle: Secondary | ICD-10-CM | POA: Diagnosis present

## 2014-01-16 DIAGNOSIS — N840 Polyp of corpus uteri: Secondary | ICD-10-CM | POA: Insufficient documentation

## 2014-01-16 DIAGNOSIS — D251 Intramural leiomyoma of uterus: Secondary | ICD-10-CM | POA: Diagnosis not present

## 2014-01-16 LAB — CBC
HCT: 40.1 % (ref 36.0–46.0)
HEMOGLOBIN: 13.6 g/dL (ref 12.0–15.0)
MCH: 30.1 pg (ref 26.0–34.0)
MCHC: 33.9 g/dL (ref 30.0–36.0)
MCV: 88.7 fL (ref 78.0–100.0)
Platelets: 216 10*3/uL (ref 150–400)
RBC: 4.52 MIL/uL (ref 3.87–5.11)
RDW: 13 % (ref 11.5–15.5)
WBC: 8.5 10*3/uL (ref 4.0–10.5)

## 2014-01-16 LAB — PREGNANCY, URINE: PREG TEST UR: NEGATIVE

## 2014-01-16 SURGERY — DILATATION & CURETTAGE/HYSTEROSCOPY WITH MYOSURE
Anesthesia: General | Site: Vagina

## 2014-01-16 MED ORDER — ONDANSETRON HCL 4 MG/2ML IJ SOLN
INTRAMUSCULAR | Status: DC | PRN
  Administered 2014-01-16: 4 mg via INTRAVENOUS

## 2014-01-16 MED ORDER — LACTATED RINGERS IV SOLN
INTRAVENOUS | Status: DC
  Administered 2014-01-16 (×2): via INTRAVENOUS

## 2014-01-16 MED ORDER — FENTANYL CITRATE 0.05 MG/ML IJ SOLN
25.0000 ug | INTRAMUSCULAR | Status: DC | PRN
  Administered 2014-01-16: 50 ug via INTRAVENOUS
  Administered 2014-01-16: 25 ug via INTRAVENOUS

## 2014-01-16 MED ORDER — KETOROLAC TROMETHAMINE 30 MG/ML IJ SOLN
INTRAMUSCULAR | Status: DC | PRN
  Administered 2014-01-16: 30 mg via INTRAVENOUS

## 2014-01-16 MED ORDER — SCOPOLAMINE 1 MG/3DAYS TD PT72
MEDICATED_PATCH | TRANSDERMAL | Status: AC
  Administered 2014-01-16: 1.5 mg via TRANSDERMAL
  Filled 2014-01-16: qty 1

## 2014-01-16 MED ORDER — GLYCOPYRROLATE 0.2 MG/ML IJ SOLN
INTRAMUSCULAR | Status: DC | PRN
  Administered 2014-01-16: 0.1 mg via INTRAVENOUS

## 2014-01-16 MED ORDER — DEXAMETHASONE SODIUM PHOSPHATE 10 MG/ML IJ SOLN
INTRAMUSCULAR | Status: AC
Start: 2014-01-16 — End: 2014-01-16
  Filled 2014-01-16: qty 1

## 2014-01-16 MED ORDER — PROMETHAZINE HCL 25 MG/ML IJ SOLN
INTRAMUSCULAR | Status: AC
  Administered 2014-01-16: 6.25 mg via INTRAVENOUS
  Filled 2014-01-16: qty 1

## 2014-01-16 MED ORDER — LIDOCAINE HCL (CARDIAC) 20 MG/ML IV SOLN
INTRAVENOUS | Status: AC
  Filled 2014-01-16: qty 5

## 2014-01-16 MED ORDER — PROPOFOL 10 MG/ML IV EMUL
INTRAVENOUS | Status: AC
  Filled 2014-01-16: qty 20

## 2014-01-16 MED ORDER — SODIUM CHLORIDE 0.9 % IR SOLN
Status: DC | PRN
  Administered 2014-01-16 (×2): 3000 mL

## 2014-01-16 MED ORDER — SCOPOLAMINE 1 MG/3DAYS TD PT72
1.0000 | MEDICATED_PATCH | Freq: Once | TRANSDERMAL | Status: DC
  Administered 2014-01-16: 1.5 mg via TRANSDERMAL

## 2014-01-16 MED ORDER — CEFAZOLIN SODIUM-DEXTROSE 2-3 GM-% IV SOLR
2.0000 g | INTRAVENOUS | Status: AC
  Administered 2014-01-16: 2 g via INTRAVENOUS

## 2014-01-16 MED ORDER — PROMETHAZINE HCL 25 MG/ML IJ SOLN
6.2500 mg | INTRAMUSCULAR | Status: DC | PRN
  Administered 2014-01-16: 6.25 mg via INTRAVENOUS

## 2014-01-16 MED ORDER — MIDAZOLAM HCL 2 MG/2ML IJ SOLN: 0.5000 mg | Freq: Once | INTRAMUSCULAR | Status: DC | PRN

## 2014-01-16 MED ORDER — LACTATED RINGERS IV SOLN: INTRAVENOUS | Status: DC

## 2014-01-16 MED ORDER — PROPOFOL 10 MG/ML IV BOLUS
INTRAVENOUS | Status: DC | PRN
  Administered 2014-01-16: 150 mg via INTRAVENOUS

## 2014-01-16 MED ORDER — DEXAMETHASONE SODIUM PHOSPHATE 4 MG/ML IJ SOLN
INTRAMUSCULAR | Status: DC | PRN
  Administered 2014-01-16: 4 mg via INTRAVENOUS

## 2014-01-16 MED ORDER — KETOROLAC TROMETHAMINE 30 MG/ML IJ SOLN: 15.0000 mg | Freq: Once | INTRAMUSCULAR | Status: DC | PRN

## 2014-01-16 MED ORDER — MEPERIDINE HCL 25 MG/ML IJ SOLN: 6.2500 mg | INTRAMUSCULAR | Status: DC | PRN

## 2014-01-16 MED ORDER — MIDAZOLAM HCL 2 MG/2ML IJ SOLN
INTRAMUSCULAR | Status: DC | PRN
  Administered 2014-01-16: 2 mg via INTRAVENOUS

## 2014-01-16 MED ORDER — CEFAZOLIN SODIUM-DEXTROSE 2-3 GM-% IV SOLR
INTRAVENOUS | Status: AC
  Filled 2014-01-16: qty 50

## 2014-01-16 MED ORDER — LIDOCAINE HCL 1 % IJ SOLN
INTRAMUSCULAR | Status: DC | PRN
  Administered 2014-01-16: 10 mL

## 2014-01-16 MED ORDER — FENTANYL CITRATE 0.05 MG/ML IJ SOLN
INTRAMUSCULAR | Status: DC | PRN
  Administered 2014-01-16 (×2): 25 ug via INTRAVENOUS
  Administered 2014-01-16: 100 ug via INTRAVENOUS

## 2014-01-16 MED ORDER — FENTANYL CITRATE 0.05 MG/ML IJ SOLN
INTRAMUSCULAR | Status: AC
Start: 2014-01-16 — End: 2014-01-16
  Filled 2014-01-16: qty 5

## 2014-01-16 MED ORDER — LIDOCAINE HCL (CARDIAC) 20 MG/ML IV SOLN
INTRAVENOUS | Status: DC | PRN
  Administered 2014-01-16: 80 mg via INTRAVENOUS

## 2014-01-16 MED ORDER — LIDOCAINE HCL 1 % IJ SOLN
INTRAMUSCULAR | Status: AC
  Filled 2014-01-16: qty 20

## 2014-01-16 MED ORDER — FENTANYL CITRATE 0.05 MG/ML IJ SOLN
INTRAMUSCULAR | Status: AC
  Administered 2014-01-16: 25 ug via INTRAVENOUS
  Filled 2014-01-16: qty 2

## 2014-01-16 MED ORDER — NAPROXEN SODIUM 550 MG PO TABS: 550.0000 mg | ORAL_TABLET | Freq: Two times a day (BID) | ORAL | Status: DC

## 2014-01-16 MED ORDER — ONDANSETRON HCL 4 MG/2ML IJ SOLN
INTRAMUSCULAR | Status: AC
Start: 2014-01-16 — End: 2014-01-16
  Filled 2014-01-16: qty 2

## 2014-01-16 MED ORDER — MIDAZOLAM HCL 2 MG/2ML IJ SOLN
INTRAMUSCULAR | Status: AC
Start: 2014-01-16 — End: 2014-01-16
  Filled 2014-01-16: qty 2

## 2014-01-16 SURGICAL SUPPLY — 24 items
ABLATOR ENDOMETRIAL MYOSURE (ABLATOR) ×2 IMPLANT
CANISTER SUCT 3000ML (MISCELLANEOUS) ×2 IMPLANT
CATH ROBINSON RED A/P 16FR (CATHETERS) ×2 IMPLANT
CLOTH BEACON ORANGE TIMEOUT ST (SAFETY) ×2 IMPLANT
CONTAINER PREFILL 10% NBF 60ML (FORM) ×2 IMPLANT
DEVICE MYOSURE CLASSIC (MISCELLANEOUS) IMPLANT
DEVICE MYOSURE LITE (MISCELLANEOUS) IMPLANT
DRAPE HYSTEROSCOPY (DRAPE) IMPLANT
DRSG TELFA 3X8 NADH (GAUZE/BANDAGES/DRESSINGS) IMPLANT
ELECT REM PT RETURN 9FT ADLT (ELECTROSURGICAL)
ELECTRODE REM PT RTRN 9FT ADLT (ELECTROSURGICAL) IMPLANT
FILTER ARTHROSCOPY CONVERTOR (FILTER) ×2 IMPLANT
GLOVE BIO SURGEON STRL SZ 6.5 (GLOVE) ×2 IMPLANT
GLOVE BIOGEL PI IND STRL 7.0 (GLOVE) ×1 IMPLANT
GLOVE BIOGEL PI INDICATOR 7.0 (GLOVE) ×1
GOWN STRL REUS W/TWL LRG LVL3 (GOWN DISPOSABLE) ×4 IMPLANT
NEEDLE SPNL 20GX3.5 QUINCKE YW (NEEDLE) ×2 IMPLANT
PACK VAGINAL MINOR WOMEN LF (CUSTOM PROCEDURE TRAY) ×2 IMPLANT
PAD OB MATERNITY 4.3X12.25 (PERSONAL CARE ITEMS) ×2 IMPLANT
SEAL ROD LENS SCOPE MYOSURE (ABLATOR) ×2 IMPLANT
SET TUBING HYSTEROSCOPY 2 NDL (TUBING) ×2 IMPLANT
TOWEL OR 17X24 6PK STRL BLUE (TOWEL DISPOSABLE) ×4 IMPLANT
TUBE HYSTEROSCOPY W Y-CONNECT (TUBING) ×2 IMPLANT
WATER STERILE IRR 1000ML POUR (IV SOLUTION) ×2 IMPLANT

## 2014-01-16 NOTE — H&P (Signed)
PREOPERATIVE HISTORY AND PHYSICAL  PCP: Rita Ohara  Referring provider: Rita Ohara   HPI:  48 y.o. Married Caucasian female  [id number]  Presents with irregular bleeding and heavy menstrual bleeding.   Menses were normal up until 2 - 3 years ago.  Menses are irregular, heavy, clots, and cramping.  Wearing pad and tampon with changes up to every 2 hours for 3 - 4 days.  Can skip cycles for a couple of months.  Then bleeds for 2 weeks, stops for a week, and then bleed for another week.  No hot flashes but is having night sweats.   Pelvic ultrasound 01/04/14 - Endometrium 12.97 mm with blood flow to area. Anterior right fibroid 1.79 mm.  Ovaries normal.  No free fluid. Sonohysterogram showed 23 x 26 mm intracavitary filling defect.   Endometrial biopsy 01/04/14 - blood and fragments of hypoproliferatve endometrium with tubal metaplasia.  No hyperplasia or malignancy.   Hgb: 14.1 - 12/29/13.  GYNECOLOGIC HISTORY:  Patient's last menstrual period was 12/11/2013.  Sexually active: yes  Partner preference: female  Contraception: Husband had vasectomy  Menopausal hormone therapy: N/A  DES exposure: No  Blood transfusions: No  Sexually transmitted diseases: No  GYN procedures and prior surgeries: C-Section  Last mammogram: 04/2012 BIRADS1: Neg  Last pap and high risk HPV testing: 03/2011 Normal  History of abnormal pap smear: No  OB History    Grav  Para  Term  Preterm  Abortions  TAB  SAB  Ect  Mult  Living    3  2    1   1        2  living children  LIFESTYLE:  Exercise: No  Tobacco: No  Alcohol:<1 weekly  Drug use: No  Patient Active Problem List    Diagnosis  Date Noted   .  Obesity (BMI 30.0-34.9)  06/01/2012   .  Irregular menses  06/01/2012   .  Elevated LFTs  05/04/2011   .  Mixed hyperlipidemia  05/04/2011   .  Herpes simplex labialis  04/13/2011   .  Allergic rhinitis, cause unspecified  04/13/2011    Past Medical History   Diagnosis  Date   .  Allergy      seasonal  and dust   .  Herpes simplex labialis    .  Mixed hyperlipidemia    .  Other abnormal blood chemistry     Past Surgical History   Procedure  Laterality  Date   .  Cesarean section     .  Tonsillectomy   age 47   .  Refractive surgery      Current Outpatient Prescriptions   Medication  Sig  Dispense  Refill   .  cetirizine (ZYRTEC) 10 MG tablet  Take 10 mg by mouth daily.     .  Multiple Vitamins-Minerals (MULTIVITAMIN WITH MINERALS) tablet  Take 1 tablet by mouth daily.     .  naproxen sodium (ANAPROX) 220 MG tablet  Take 220 mg by mouth daily.     .  Omega-3 Fatty Acids (FISH OIL) 500 MG CAPS  Take by mouth.     .  valACYclovir (VALTREX) 1000 MG tablet  Take 2 tablets at onset of cold sore. Repeat dose of additional 2 tablets in 12 hours (4 tablets per episode)  30 tablet  1    No current facility-administered medications for this visit.   ALLERGIES: Review of patient's allergies indicates no known allergies.  Family History  Problem  Relation  Age of Onset   .  Hyperlipidemia  Mother    .  Diabetes  Mother    .  Melanoma  Mother    .  Arthritis  Mother    .  Arthritis  Father      rheumatoid   .  Diabetes  Father    .  Heart disease  Father  86     MI, s/p CABG   .  Hypertension  Father    .  Hyperlipidemia  Father    .  Atrial fibrillation  Father    .  Diabetes  Brother      resolved after gastric bypass/weight loss   .  Hypertension  Brother      off meds s/p weight loss   .  Cancer  Paternal Grandmother      breast cancer in 46's   .  Heart disease  Maternal Grandmother      71 or younger?   .  Stroke  Maternal Grandmother      78's   .  Cancer  Paternal Aunt      breast cancer in 2 paternal aunts    History    Social History   .  Marital Status:  Married     Spouse Name:  N/A     Number of Children:  2   .  Years of Education:  N/A    Occupational History   .  Program Chair at [employer]     Social History Main Topics   .  Smoking status:  Never Smoker    .  Smokeless tobacco:  Never Used   .  Alcohol Use:  0.5 oz/week     1 drink(s) per week      Comment: less than one drink per week.   .  Drug Use:  No   .  Sexual Activity:  Yes     Partners:  Male      Comment: husband had vasectomy    Other Topics  Concern   .  Not on file    Social History Narrative    Lives with 2 sons, husband and 3 dogs   ROS: Pertinent items are noted in HPI.  PHYSICAL EXAMINATION:  BP 122/66  Pulse 92  Temp(Src) 99 F (37.2 C) (Oral)  Resp 20  Ht 5' 5.5" (1.664 m)  Wt 213 lb (96.616 kg)  BMI 34.89 kg/m2  LMP 12/11/2013  Wt Readings from Last 3 Encounters:   12/29/13  213 lb (96.616 kg)   04/26/13  213 lb (96.616 kg)   03/29/13  210 lb (95.255 kg)    Ht Readings from Last 3 Encounters:   12/29/13  5' 5.5" (1.664 m)   03/29/13  5\' 5"  (1.651 m)   10/12/12  5\' 5"  (1.651 m)   General appearance: alert, cooperative and appears stated age  Head: Normocephalic, without obvious abnormality, atraumatic  Neck: no adenopathy, supple, symmetrical, trachea midline and thyroid not enlarged, symmetric, no tenderness/mass/nodules  Lungs: clear to auscultation bilaterally  Breasts: Inspection negative, No nipple retraction or dimpling, No nipple discharge or bleeding, No axillary or supraclavicular adenopathy, Normal to palpation without dominant masses  Heart: regular rate and rhythm  Abdomen: soft, non-tender; no masses, no organomegaly  Extremities: extremities normal, atraumatic, no cyanosis or edema  Skin: Skin color, texture, turgor normal. No rashes or lesions  Lymph nodes: Cervical, supraclavicular, and axillary nodes normal.  No abnormal inguinal  nodes palpated  Neurologic: Grossly normal  Pelvic: External genitalia: no lesions  Urethra: normal appearing urethra with no masses, tenderness or lesions  Bartholins and Skenes: normal  Vagina: normal appearing vagina with normal color and discharge, no lesions, yellowish/brown blood.  Cervix: normal  appearance  Bimanual Exam: Uterus: uterus is normal size, shape, consistency and nontender  Adnexa: normal adnexa in size, nontender and no masses  Rectovaginal: Confirms  Anus: normal sphincter tone, no lesions  ASSESSMENT  Menorrhagia.  Metrorrhagia.  Intramural uterine fibroid.  Endometrial polyp. Perimenopausal female.  PLAN  Proceed with hysteroscopy with suspected polypectomy with Myosure.   Risks, benefits, and alternatives discussed with the patient who wishes to proceed.

## 2014-01-16 NOTE — Op Note (Signed)
NAME:  Stacy Boyer, Stacy Boyer NO.:  [id number]  MEDICAL RECORD NO.:  [id number]  LOCATION:  [facility]                          FACILITY:  [facility]  PHYSICIAN:  Lenard Galloway, M.D.   DATE OF BIRTH:  March 04, 1966  DATE OF PROCEDURE:  01/16/2014 DATE OF DISCHARGE:                              OPERATIVE REPORT   PREOPERATIVE DIAGNOSES:  Menometrorrhagia, endometrial mass.  POSTOPERATIVE DIAGNOSES:  Menometrorrhagia, endometrial polyp.  PROCEDURES:  Hysteroscopic resection of endometrial polyp using MyoSure, dilation and curettage.  SURGEON:  Lenard Galloway, M.D.  ANESTHESIA:  LMA with paracervical block with 10 mL 1% lidocaine.  IV FLUIDS:  1000 mL Ringer's lactate.  ESTIMATED BLOOD LOSS:  10 mL.  URINE OUTPUT:  25 mL by red rubber catheter.  SALINE DEFICIT:  150 mL.  COMPLICATIONS:  None.  INDICATIONS FOR THE PROCEDURE:  The patient is a 48 year old, gravida 3, para 1-0-1-1 female, who presents with irregular and heavy menstrual bleeding.  The patient underwent a pelvic ultrasound on January 04, 2014 documenting thickened endometrium with blood flow to the endometrium.  She has an intramural anterior 1.7 cm fibroid.  The ovaries were unremarkable.  Sonohysterogram documented a 23 x 26 mm intracavitary filling defect.  An endometrial biopsy on January 04, 2014, showed hypoproliferative endometrium with tubal metaplasia.  No evidence of hyperplasia or malignancy.  A plan is made now to proceed with a hysteroscopic resection of a suspected endometrial polyp using the MyoSure device and proceeding with a dilation and curettage.  Risks, benefits, and alternatives have been reviewed with the patient, who wishes to proceed.  FINDINGS:  Exam under anesthesia revealed a midposition, nonenlarged mobile uterus.  No adnexal masses were appreciated.  Hysteroscopy demonstrated a 2.5 cm intracavitary polyp, which was attached along the posterior and left fundal endometrial wall.   The right tubal ostia was visualized.  The left tubal ostia was not visualized even after removal of the large polyp.  There were no masses in the endocervix.  SPECIMENS:  The endometrial polyp was sent to pathology separately from endometrial curettings.  PROCEDURE IN DETAIL:  The patient was reidentified in the preoperative hold area.  She did receive cefazolin 2 g IV for antibiotic prophylaxis along with TED hose and PAS stockings for DVT prophylaxis.  The patient was brought to the operating room and placed in the supine position on the operating room table.  LMA anesthetic was administered and she was placed in the dorsal lithotomy position.  The lower abdomen, vagina, and perineum were sterilely prepped and the patient was draped after she was catheterized with a red rubber catheter.  An exam under anesthesia was performed.  A speculum was placed inside the vagina and a single-tooth tenaculum placed on the anterior cervical lip.  A paracervical block was performed with a total of 10 mL of 1% lidocaine.  The uterus was sounded to almost 9 cm.  The cervix was then dilated to a #25 Pratt dilator.  The MyoSure scope was inserted into the uterine cavity under the continuous infusion of normal saline and the findings are as noted above.  The medium blade was then inserted into the MyoSure device and the polyp was  resected with ease.  Hemostasis was good  during the procedure.  After complete termination and removal of the polyp, the MyoSure device was removed and the cervix was then dilated to a #23 Pratt dilator.  A serrated curette was used to gently curette all 4 quadrants of the endometrium and this sample was sent to Pathology separately from the endometrial polyp.  The tenaculum was removed from the anterior cervical lip.  The patient was observed for bleeding and there was very minimal bleeding at the termination of the procedure and it was therefore deemed complete.  The  speculum was removed from the vagina.  The patient was cleansed of Betadine.  She was awakened and sent to the recovery room in stable condition.  There were no complications to the procedure.  All needle, instrument, and sponge counts were correct.     Lenard Galloway, M.D.     BES/MEDQ  D:  01/16/2014  T:  01/16/2014  Job:  [id number]

## 2014-01-16 NOTE — OR Nursing (Signed)
Fentanyl 92mcg wasted in sink. Witnessed by [employer]. Darin Engels rn

## 2014-01-16 NOTE — Anesthesia Preprocedure Evaluation (Signed)
Anesthesia Evaluation  Patient identified by MRN, date of birth, ID band Patient awake    Reviewed: Allergy & Precautions, H&P , Patient's Chart, lab work & pertinent test results, reviewed documented beta blocker date and time   History of Anesthesia Complications Negative for: history of anesthetic complications  Airway Mallampati: II  TM Distance: >3 FB Neck ROM: full    Dental   Pulmonary  breath sounds clear to auscultation        Cardiovascular Exercise Tolerance: Good Rhythm:regular Rate:Normal     Neuro/Psych negative psych ROS   GI/Hepatic   Endo/Other    Renal/GU      Musculoskeletal   Abdominal   Peds  Hematology   Anesthesia Other Findings   Reproductive/Obstetrics                             Anesthesia Physical Anesthesia Plan  ASA: II  Anesthesia Plan: General LMA   Post-op Pain Management:    Induction:   Airway Management Planned:   Additional Equipment:   Intra-op Plan:   Post-operative Plan:   Informed Consent: I have reviewed the patients History and Physical, chart, labs and discussed the procedure including the risks, benefits and alternatives for the proposed anesthesia with the patient or authorized representative who has indicated his/her understanding and acceptance.   Dental Advisory Given  Plan Discussed with: CRNA, Surgeon and Anesthesiologist  Anesthesia Plan Comments:         Anesthesia Quick Evaluation  

## 2014-01-16 NOTE — Discharge Instructions (Signed)
Hysteroscopy, Care After Refer to this sheet in the next few weeks. These instructions provide you with information on caring for yourself after your procedure. Your health care provider may also give you more specific instructions. Your treatment has been planned according to current medical practices, but problems sometimes occur. Call your health care provider if you have any problems or questions after your procedure.  WHAT TO EXPECT AFTER THE PROCEDURE After your procedure, it is typical to have the following:  You may have some cramping. This normally lasts for a couple days.  You may have bleeding. This can vary from light spotting for a few days to menstrual-like bleeding for 3-7 days. HOME CARE INSTRUCTIONS  Rest for the first 1-2 days after the procedure.  Only take over-the-counter or prescription medicines as directed by your health care provider. Do not take aspirin. It can increase the chances of bleeding.  Take showers instead of baths for 2 weeks or as directed by your health care provider.  Do not drive for 24 hours or as directed.  Do not drink alcohol while taking pain medicine.  Do not use tampons, douche, or have sexual intercourse for 2 weeks or until your health care provider says it is okay.  Take your temperature twice a day for 4-5 days. Write it down each time.  Follow your health care provider's advice about diet, exercise, and lifting.  If you develop constipation, you may:  Take a mild laxative if your health care provider approves.  Add bran foods to your diet.  Drink enough fluids to keep your urine clear or pale yellow.  Try to have someone with you or available to you for the first 24-48 hours, especially if you were given a general anesthetic.  Follow up with your health care provider as directed. SEEK MEDICAL CARE IF:  You feel dizzy or lightheaded.  You feel sick to your stomach (nauseous).  You have abnormal vaginal discharge.  You  have a rash.  You have pain that is not controlled with medicine. SEEK IMMEDIATE MEDICAL CARE IF:  You have bleeding that is heavier than a normal menstrual period.  You have a fever.  You have increasing cramps or pain, not controlled with medicine.  You have new belly (abdominal) pain.  You pass out.  You have pain in the tops of your shoulders (shoulder strap areas).  You have shortness of breath. Document Released: 02/01/2013 Document Reviewed: 02/01/2013 [facility] Patient Information 2015 [place], [place]. This information is not intended to replace advice given to you by your health care provider. Make sure you discuss any questions you have with your health care provider.  Dilation and Curettage or Vacuum Curettage, Care After Refer to this sheet in the next few weeks. These instructions provide you with information on caring for yourself after your procedure. Your health care provider may also give you more specific instructions. Your treatment has been planned according to current medical practices, but problems sometimes occur. Call your health care provider if you have any problems or questions after your procedure. WHAT TO EXPECT AFTER THE PROCEDURE After your procedure, it is typical to have light cramping and bleeding. This may last for 2 days to 2 weeks after the procedure. HOME CARE INSTRUCTIONS   Do not drive for 24 hours.  Wait 1 week before returning to strenuous activities.  Take your temperature 2 times a day for 4 days and write it down. Provide these temperatures to your health care provider if  you develop a fever.  Avoid long periods of standing.  Avoid heavy lifting, pushing, or pulling. Do not lift anything heavier than 10 pounds (4.5 kg).  Limit stair climbing to once or twice a day.  Take rest periods often.  You may resume your usual diet.  Drink enough fluids to keep your urine clear or pale yellow.  Your usual bowel function should return. If  you have constipation, you may:  Take a mild laxative with permission from your health care provider.  Add fruit and bran to your diet.  Drink more fluids.  Take showers instead of baths until your health care provider gives you permission to take baths.  Do not go swimming or use a hot tub until your health care provider approves.  Try to have someone with you or available to you the first 24-48 hours, especially if you were given a general anesthetic.  Do not douche, use tampons, or have sex (intercourse) for 2 weeks after the procedure.  Only take over-the-counter or prescription medicines as directed by your health care provider. Do not take aspirin. It can cause bleeding.  Follow up with your health care provider as directed. SEEK MEDICAL CARE IF:   You have increasing cramps or pain that is not relieved with medicine.  You have abdominal pain that does not seem to be related to the same area of earlier cramping and pain.  You have bad smelling vaginal discharge.  You have a rash.  You are having problems with any medicine. SEEK IMMEDIATE MEDICAL CARE IF:   You have bleeding that is heavier than a normal menstrual period.  You have a fever.  You have chest pain.  You have shortness of breath.  You feel dizzy or feel like fainting.  You pass out.  You have pain in your shoulder strap area.  You have heavy vaginal bleeding with or without blood clots. MAKE SURE YOU:   Understand these instructions.  Will watch your condition.  Will get help right away if you are not doing well or get worse. Document Released: 04/10/2000 Document Revised: 04/18/2013 Document Reviewed: 11/10/2012 [facility] Patient Information 2015 [place], [place]. This information is not intended to replace advice given to you by your health care provider. Make sure you discuss any questions you have with your health care provider. DISCHARGE INSTRUCTIONS: D&C / D&E The following instructions  have been prepared to help you care for yourself upon your return home.   Personal hygiene:  Use sanitary pads for vaginal drainage, not tampons.  Shower the day after your procedure.  NO tub baths, pools or Jacuzzis for 2-3 weeks.  Wipe front to back after using the bathroom.  Activity and limitations:  Do NOT drive or operate any equipment for 24 hours. The effects of anesthesia are still present and drowsiness may result.  Do NOT rest in bed all day.  Walking is encouraged.  Walk up and down stairs slowly.  You may resume your normal activity in one to two days or as indicated by your physician.  Sexual activity: NO intercourse for at least 2 weeks after the procedure, or as indicated by your physician.  Diet: Eat a light meal as desired this evening. You may resume your usual diet tomorrow.  Return to work: You may resume your work activities in one to two days or as indicated by your doctor.  What to expect after your surgery: Expect to have vaginal bleeding/discharge for 2-3 days and spotting for up  to 10 days. It is not unusual to have soreness for up to 1-2 weeks. You may have a slight burning sensation when you urinate for the first day. Mild cramps may continue for a couple of days. You may have a regular period in 2-6 weeks.  Call your doctor for any of the following:  Excessive vaginal bleeding, saturating and changing one pad every hour.  Inability to urinate 6 hours after discharge from hospital.  Pain not relieved by pain medication.  Fever of 100.4 F or greater.  Unusual vaginal discharge or odor.   Call for an appointment:    Patients signature: ______________________  Nurses signature ________________________  Support person's signature_______________________

## 2014-01-16 NOTE — Brief Op Note (Signed)
01/16/2014  8:14 AM  PATIENT:  Stacy Boyer  48 y.o. female  PRE-OPERATIVE DIAGNOSIS:  menorrhagia, metrorrhagia,endometrial mass  POST-OPERATIVE DIAGNOSIS:  menorrhagia, metrorrhagia,endometrial mass  PROCEDURE:  Procedure(s): DILATATION & CURETTAGE/HYSTEROSCOPY WITH MYOSURE ABLATION (N/A) Hysteroscopic polypectomy  SURGEON:  Surgeon(s) and Role:    * Brook E Amundson de Berton Lan, MD - Primary  PHYSICIAN ASSISTANT:   ASSISTANTS: none   ANESTHESIA:   paracervical block and  LMA  EBL:  Total I/O In: 1000 [I.V.:1000] Out: 10 [Blood:10]  BLOOD ADMINISTERED:none  DRAINS: none   LOCAL MEDICATIONS USED:  LIDOCAINE  and Amount:  10 ml  SPECIMEN:  Source of Specimen:   Endometrial polyp, endometrial curettings  DISPOSITION OF SPECIMEN:  PATHOLOGY  COUNTS:  YES  TOURNIQUET:  * No tourniquets in log *  DICTATION: .Other Dictation: Dictation Number    PLAN OF CARE: Discharge to home after PACU  PATIENT DISPOSITION:  PACU - hemodynamically stable.   Delay start of Pharmacological VTE agent (>24hrs) due to surgical blood loss or risk of bleeding: not applicable

## 2014-01-16 NOTE — Anesthesia Procedure Notes (Signed)
Procedure Name: LMA Insertion Date/Time: 01/16/2014 7:32 AM Performed by: Flossie Dibble Pre-anesthesia Checklist: Patient identified, Timeout performed, Emergency Drugs available, Suction available and Patient being monitored Patient Re-evaluated:Patient Re-evaluated prior to inductionOxygen Delivery Method: Circle system utilized Preoxygenation: Pre-oxygenation with 100% oxygen Intubation Type: IV induction LMA: LMA inserted LMA Size: 4.0 Number of attempts: 1 Placement Confirmation: breath sounds checked- equal and bilateral and positive ETCO2 Tube secured with: Tape Dental Injury: Teeth and Oropharynx as per pre-operative assessment

## 2014-01-16 NOTE — Transfer of Care (Signed)
Immediate Anesthesia Transfer of Care Note  Patient: Stacy Boyer  Procedure(s) Performed: Procedure(s): DILATATION & CURETTAGE/HYSTEROSCOPY WITH MYOSURE ABLATION (N/A)  Patient Location: PACU  Anesthesia Type:General  Level of Consciousness: awake, alert  and oriented  Airway & Oxygen Therapy: Patient Spontanous Breathing and Patient connected to nasal cannula oxygen  Post-op Assessment: Report given to PACU RN and Post -op Vital signs reviewed and stable  Post vital signs: Reviewed and stable  Complications: No apparent anesthesia complications

## 2014-01-16 NOTE — Anesthesia Postprocedure Evaluation (Signed)
  Anesthesia Post-op Note  Patient: Stacy Boyer  Procedure(s) Performed: Procedure(s): DILATATION & CURETTAGE/HYSTEROSCOPY WITH MYOSURE ABLATION (N/A)  Patient Location: PACU  Anesthesia Type:General  Level of Consciousness: awake, alert  and oriented  Airway and Oxygen Therapy: Patient Spontanous Breathing  Post-op Pain: mild  Post-op Assessment: Post-op Vital signs reviewed, Patient's Cardiovascular Status Stable, Respiratory Function Stable, Patent Airway, No signs of Nausea or vomiting and Pain level controlled  Post-op Vital Signs: Reviewed and stable  Last Vitals:  Filed Vitals:   01/16/14 0952  BP:   Pulse: 73  Temp: 36.6 C  Resp: 19    Complications: No apparent anesthesia complications

## 2014-01-16 NOTE — Progress Notes (Signed)
Update to History and Physical  Patient examined.  No marked change in status.  OK to proceed with surgery.

## 2014-01-18 ENCOUNTER — Telehealth: Payer: Self-pay | Admitting: *Deleted

## 2014-01-18 NOTE — Telephone Encounter (Signed)
Per Dr Quincy Simmonds, Pathology report indicates need for additional treatment. No evidence of cancer but would like to see patient to discuss results and options Call to patient. LMTCB.

## 2014-01-18 NOTE — Telephone Encounter (Signed)
Return call to patient. Advised of message from Dr Quincy Simmonds. Consult appointment scheduled for tomorrow 01-19-14 at 10. Assured patient this is not emergent, Dr Quincy Simmonds just didn't want her to have towait till 2 week post op.  Routing to provider for final review. Patient agreeable to disposition. Will close encounter

## 2014-01-18 NOTE — Telephone Encounter (Signed)
Patient returning Sally's call. °

## 2014-01-18 NOTE — Telephone Encounter (Signed)
Return call to Sally. °

## 2014-01-19 ENCOUNTER — Ambulatory Visit (INDEPENDENT_AMBULATORY_CARE_PROVIDER_SITE_OTHER): Payer: 59 | Admitting: Obstetrics and Gynecology

## 2014-01-19 ENCOUNTER — Encounter: Payer: Self-pay | Admitting: Obstetrics and Gynecology

## 2014-01-19 VITALS — BP 118/72 | HR 88 | Resp 16 | Ht 65.5 in | Wt 214.0 lb

## 2014-01-19 DIAGNOSIS — N8502 Endometrial intraepithelial neoplasia [EIN]: Secondary | ICD-10-CM

## 2014-01-19 NOTE — Progress Notes (Signed)
Patient ID: Stacy Boyer, female   DOB: June 18, 1965, 48 y.o.   MRN: [id number] GYNECOLOGY  VISIT   HPI: 48 y.o.   Married  Caucasian  female   [phone] with Patient's last menstrual period was 12/11/2013.   here for  Consultation regarding endometrial biopsy results. Status post hysteroscopic polypectomy with Myosure, dilation and curettage 01/16/14. Doing well post op.  Had very little pain and bleeding after surgery.   Husband present for entire visit today.  HusaINAL DIAGNOSIS Final Pathology Report  Diagnosis 1. Endometrial polyp - FRAGMENTS OF ENDOMETRIAL POLYP WITH ATYPICAL ENDOMETRIAL HYPERPLASIA. 2. Endometrium, curettage - BENIGN WEAKLY PROLIFERATIVE ENDOMETRIUM, NO ATYPIA, HYPERPLASIA OR MALIGNANCY. Microscopic Comment 1. There are simple and complex endometrial hyperplasia with cytologic atypia, arranged in cribriform pattern. However, there is no evidence of carcinoma identified in this material. Dr. Gari Crown agrees. Aldona Bar MD Pathologist, Electronic Signature (Case signed 01/17/2014)  GYNECOLOGIC HISTORY: Patient's last menstrual period was 12/11/2013. Contraception:  vasectomy  Menopausal hormone therapy: n/a        OB History   Grav Para Term Preterm Abortions TAB SAB Ect Mult Living   3 2   1  1   2          Patient Active Problem List   Diagnosis Date Noted  . Obesity (BMI 30.0-34.9) 06/01/2012  . Irregular menses 06/01/2012  . Elevated LFTs 05/04/2011  . Mixed hyperlipidemia 05/04/2011  . 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 Allergic rhinitis, cause unspecified 04/13/2011    Past Medical History  Diagnosis Date  . Allergy     seasonal and dust  . Herpes simplex labialis   . Mixed hyperlipidemia   . Other abnormal blood chemistry     Past Surgical History  Procedure Laterality Date  . Cesarean section    . Tonsillectomy  age 36  . Refractive surgery    . Dilation and curettage of uterus  1995    miscarrige    Current Outpatient  Prescriptions  Medication Sig Dispense Refill  . b complex vitamins tablet Take 1 tablet by mouth daily.      . cetirizine (ZYRTEC) 10 MG tablet Take 10 mg by mouth daily.      . naproxen sodium (ANAPROX) 550 MG tablet Take 1 tablet (550 mg total) by mouth 2 (two) times daily with a meal. Takes as needed for pain.  30 tablet  0  . Omega-3 Fatty Acids (FISH OIL) 500 MG CAPS Take 1 capsule by mouth daily. MegaRed      . valACYclovir (VALTREX) 1000 MG tablet Take 2 tablets at onset of cold sore.  Repeat dose of additional 2 tablets in 12 hours (4 tablets per episode)  30 tablet  1   No current facility-administered medications for this visit.     ALLERGIES: Lactose intolerance (gi)  Family History  Problem Relation Age of Onset  . Hyperlipidemia Mother   . Diabetes Mother   . Melanoma Mother   . Arthritis Mother   . Arthritis Father     rheumatoid  . Diabetes Father   . Heart disease Father 32    MI, s/p CABG  . Hypertension Father   . Hyperlipidemia Father   . Atrial fibrillation Father   . Diabetes Brother     resolved after gastric bypass/weight loss   . Hypertension Brother     off meds s/p weight loss  . Cancer Paternal Grandmother     breast cancer in 34's  . Heart disease  Maternal Grandmother     52 or younger?  . Stroke Maternal Grandmother     56's  . Cancer Paternal Aunt     breast cancer in 2 paternal aunts    History   Social History  . Marital Status: Married    Spouse Name: N/A    Number of Children: 2  . Years of Education: N/A   Occupational History  . Program Chair at [employer]    Social History Main Topics  . Smoking status: Never Smoker   . Smokeless tobacco: Never Used  . Alcohol Use: 0.5 oz/week    1 drink(s) per week     Comment: less than one drink per week.  . Drug Use: No  . Sexual Activity: Yes    Partners: Male     Comment: husband had vasectomy   Other Topics Concern  . Not on file   Social History Narrative   Lives with 2  sons, husband and 3 dogs    ROS:  Pertinent items are noted in HPI.  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 Status post hysteroscopic polypectomy with Myosure, dilation and curettage. History of Cesarean Section.   PLAN  Discussion of complex atypical hyperplasia, etiology, risk of development of endometrial cancer if untreated, and small risk of hidden malignancy currently.  Written information also to patient from [employer]. Copy of pathology report to patient.  Recommendation for Robotic total laparoscopic hysterectomy with bilateral salpingo-oophorectomy. Discussed procedure and gave DVD to watch. Reviewed risks, benefits, and alternatives.  Understands that progesterone therapy is treatment for hyperplasia for patients who have not completed childbearing.  Will proceed forward with precertification of surgery and do a more formal preop visit with informed consent at that time.    An After Visit Summary was printed and given to the patient.  __25___ minutes face to face time of which over 50% was spent in counseling.

## 2014-01-19 NOTE — Patient Instructions (Signed)
Please review the materials I shared with you today.  The surgical video may also be very helpful.

## 2014-01-20 ENCOUNTER — Encounter: Payer: Self-pay | Admitting: Obstetrics and Gynecology

## 2014-01-23 ENCOUNTER — Encounter: Payer: Self-pay | Admitting: Family Medicine

## 2014-01-24 ENCOUNTER — Telehealth: Payer: Self-pay | Admitting: *Deleted

## 2014-01-24 ENCOUNTER — Encounter: Payer: Self-pay | Admitting: Obstetrics and Gynecology

## 2014-01-24 NOTE — Telephone Encounter (Signed)
Call to patient to discuss surgery date options. Patient really wants first or second week of November. These dates are currentlyt full on Dr Elza Rafter schedule. Advised available dates of 02-12-14 and 04-10-14. Patient will have to do some major changing of schedule to make this work. Advised to consider these options while I review dates with Dr Quincy Simmonds and will talk with her again tomorrow.

## 2014-01-24 NOTE — Telephone Encounter (Signed)
Call to patient. Advised Dr Quincy Simmonds and Dr Sabra Heck have reviewed surgery schedule and made an adjustment to accommodate patient date request. Will schedule at hospital and call her back to confirm. Surgical instruction sheet reviewed with patient and printed copy to be mailed once case confirmed posted. Surgical appointments scheduled What type of bowel prep will she need?

## 2014-01-24 NOTE — Telephone Encounter (Signed)
See phone note. I have talked with patient directly today regarding dates.

## 2014-01-25 NOTE — Telephone Encounter (Signed)
Call to patient. Surgical date confirmed now that posted at hospital. Bowel prep instructions given. Printed surgical instruction sheet mailed.   Encounter closed.

## 2014-01-25 NOTE — Telephone Encounter (Signed)
One bottle of magnesium citrate and clear liquids the day prior to surgery is fine.  Thanks.

## 2014-01-28 ENCOUNTER — Encounter: Payer: Self-pay | Admitting: Obstetrics and Gynecology

## 2014-02-01 ENCOUNTER — Ambulatory Visit: Payer: 59 | Admitting: Obstetrics and Gynecology

## 2014-02-06 ENCOUNTER — Encounter: Payer: Self-pay | Admitting: Internal Medicine

## 2014-02-08 ENCOUNTER — Ambulatory Visit (INDEPENDENT_AMBULATORY_CARE_PROVIDER_SITE_OTHER): Payer: 59 | Admitting: Obstetrics and Gynecology

## 2014-02-08 ENCOUNTER — Encounter: Payer: Self-pay | Admitting: Obstetrics and Gynecology

## 2014-02-08 VITALS — BP 130/80 | HR 72 | Ht 65.5 in | Wt 217.0 lb

## 2014-02-08 DIAGNOSIS — N8502 Endometrial intraepithelial neoplasia [EIN]: Secondary | ICD-10-CM

## 2014-02-08 DIAGNOSIS — N8501 Benign endometrial hyperplasia: Secondary | ICD-10-CM

## 2014-02-08 NOTE — Progress Notes (Signed)
GYNECOLOGY  VISIT   HPI: 48 y.o.   Married  Caucasian female   [phone] with Patient's last menstrual period was 12/11/2013.   here for  Surgical discussion. Pre-op for ROBOTIC ASSISTED TOTAL HYSTERECTOMY WITH BILATERAL SALPINGO OOPHORECTOMY WITH PELVIC WASHINGS CYSTOSCOPY  Patient is status post hysteroscopic resection of endometrial polyp using Myosure, dilation and curettage for abnormal uterine bleeding on 01/16/14. 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 No evidence of malignancy.  Patient has had no further bleeding.   Pelvic ultrasound 01/04/14 - Endometrium 12.97 mm with blood flow to area. Anterior right fibroid 1.79 mm.  Ovaries normal.  No free fluid.  Sonohysterogram showed 23 x 26 mm intracavitary filling defect.  Endometrial biopsy 01/04/14 - blood and fragments of hypoproliferatve endometrium with tubal metaplasia. No hyperplasia or malignancy.   GYNECOLOGIC HISTORY: Patient's last menstrual period was 12/11/2013. Contraception:   NA Menopausal hormone therapy:NA         OB History   Grav Para Term Preterm Abortions TAB SAB Ect Mult Living   3 2   1  1   2          Patient Active Problem List   Diagnosis Date Noted  . Obesity (BMI 30.0-34.9) 06/01/2012  . Irregular menses 06/01/2012  . Elevated LFTs 05/04/2011  . Mixed hyperlipidemia 05/04/2011  . 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 Allergic rhinitis, cause unspecified 04/13/2011    Past Medical History  Diagnosis Date  . Allergy     seasonal and dust  . Herpes simplex labialis   . Mixed hyperlipidemia   . Other abnormal blood chemistry     Past Surgical History  Procedure Laterality Date  . Tonsillectomy  age 41  . Refractive surgery    . Dilation and curettage of uterus  1995    miscarrige  . Cesarean section      Current Outpatient Prescriptions  Medication Sig Dispense Refill  . b complex vitamins tablet Take 1 tablet by mouth daily.      . cetirizine (ZYRTEC) 10  MG tablet Take 10 mg by mouth daily.      . naproxen sodium (ANAPROX) 550 MG tablet Take 1 tablet (550 mg total) by mouth 2 (two) times daily with a meal. Takes as needed for pain.  30 tablet  0  . Omega-3 Fatty Acids (FISH OIL) 500 MG CAPS Take 1 capsule by mouth daily. MegaRed      . valACYclovir (VALTREX) 1000 MG tablet Take 2 tablets at onset of cold sore.  Repeat dose of additional 2 tablets in 12 hours (4 tablets per episode)  30 tablet  1   No current facility-administered medications for this visit.     ALLERGIES: Lactose intolerance (gi)  Family History  Problem Relation Age of Onset  . Hyperlipidemia Mother   . Diabetes Mother   . Melanoma Mother   . Arthritis Mother   . Arthritis Father     rheumatoid  . Diabetes Father   . Heart disease Father 45    MI, s/p CABG  . Hypertension Father   . Hyperlipidemia Father   . Atrial fibrillation Father   . Diabetes Brother     resolved after gastric bypass/weight loss   . Hypertension Brother     off meds s/p weight loss  . Cancer Paternal Grandmother     breast cancer in 28's  . Heart disease Maternal Grandmother     47 or younger?  . Stroke Maternal Grandmother  72's  . Cancer Paternal Aunt     breast cancer in 2 paternal aunts    History   Social History  . Marital Status: Married    Spouse Name: N/A    Number of Children: 2  . Years of Education: N/A   Occupational History  . Program Chair at [employer]    Social History Main Topics  . Smoking status: Never Smoker   . Smokeless tobacco: Never Used  . Alcohol Use: 0.5 oz/week    1 drink(s) per week     Comment: less than one drink per week.  . Drug Use: No  . Sexual Activity: Yes    Partners: Male     Comment: husband had vasectomy   Other Topics Concern  . Not on file   Social History Narrative   Lives with 2 sons, husband and 3 dogs    ROS:  Pertinent items are noted in HPI.  PHYSICAL EXAMINATION:    BP 130/80  Pulse 72  Ht 5' 5.5" (1.664  m)  Wt 217 lb (98.431 kg)  BMI 35.55 kg/m2  LMP 12/11/2013     General appearance: alert, cooperative and appears stated age Lungs: clear to auscultation bilaterally Heart: regular rate and rhythm Abdomen: Pfannenstiel incision, soft, non-tender; no masses,  no organomegaly No abnormal inguinal nodes palpated  Pelvic: External genitalia:  no lesions              Urethra:  normal appearing urethra with no masses, tenderness or lesions              Bartholins and Skenes: normal                 Vagina: normal appearing vagina with normal color and discharge, no lesions              Cervix: normal appearance.  No uterine decensus.                   Bimanual Exam:  Uterus:  uterus is normal size, shape, consistency and nontender                                      Adnexa: normal adnexa in size, nontender and no masses                                    ASSESSMENT  Complex endometrial hyperplasia with atypia noted in a polyp at time of hysteroscopic resection.  PLAN  Proceed with robotic total laparoscopic hysterectomy with bilateral salpingo-oophorectomy, cystoscopy, collection of pelvic washings.  Risks, benefits, and alternatives discussed with patient who wishes to proceed.  Risks include but are not limited to bleeding, infection, damage to surrounding organs, DVT, PE, death, reaction to anesthesia, pneumonia, unanticipated diagnosis of cancer requiring further surgical and other medical care, menopausal symptoms that can be treated.  Watched [facility] video on hysterectomy.  Has received written materials on hysterectomy and watched DVD on robotic hysterectomy also. Indicates understanding of diagnosis, procedure, surgical expectations, and recovery.   An After Visit Summary was printed and given to the patient.  __25____ minutes face to face time of which over 50% was spent in counseling.

## 2014-02-08 NOTE — Patient Instructions (Signed)
Please complete the magnesium citrate bowel prep the day prior to your surgery.  You may drink cear liquids after your start the bowel prep.  Nothing by mouth after midnight the night before surgery.

## 2014-02-10 DIAGNOSIS — N8501 Benign endometrial hyperplasia: Secondary | ICD-10-CM | POA: Insufficient documentation

## 2014-02-19 ENCOUNTER — Telehealth: Payer: Self-pay | Admitting: Obstetrics and Gynecology

## 2014-02-19 NOTE — Telephone Encounter (Signed)
Pt says she is still waiting on a call from the hospital to schedule her pre-op for surgery.

## 2014-02-19 NOTE — Telephone Encounter (Signed)
Call to PAT dept, LM patient asking to schedule PAT appt.  Call to patient, given phone number of [phone]. Advised that she should hear from [facility] in the next day or so to schedule PAT appointment. Needs FMLA forms completed. Advised of office policy regarding these forms, 2 weeks and $25 fee. Patient to bring these today.  Routing to provider for final review. Patient agreeable to disposition. Will close encounter

## 2014-02-21 ENCOUNTER — Encounter (HOSPITAL_COMMUNITY): Payer: Self-pay | Admitting: Pharmacist

## 2014-02-21 ENCOUNTER — Telehealth: Payer: Self-pay | Admitting: Obstetrics and Gynecology

## 2014-02-21 NOTE — Telephone Encounter (Signed)
Called to update surgery date with notifications dept at [facility].  Date: 10.27.2015 Time: 30 [id number]:[id number] M  Confirmed auth for surgery date to 11.09.2015 Ref# [id number]  Call ref #[id number]

## 2014-02-21 NOTE — Telephone Encounter (Signed)
Olin Hauser from [facility] called with the insurance company on the line requesting to know where the hyperplasia is located?

## 2014-02-21 NOTE — Telephone Encounter (Signed)
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  Call forwarded to Sabrina to confirm precert information.   Routing to provider for final review. Patient agreeable to disposition. Will close encounter

## 2014-02-22 NOTE — Patient Instructions (Addendum)
   Your procedure is scheduled on:  Monday, Nov 9  Enter through the [employer] [facility] at: 6 AM Pick up the phone at the desk and dial [phone] and inform us of your arrival.  Please call this number if you have any problems the morning of surgery: [phone]  Remember: Do not eat or  Drink after midnight: Sunday Take these medicines the morning of surgery with a SIP OF WATER: zyrtec if needed.  You may also use your eyes drops if needed.  Do not wear jewelry, make-up, or FINGER nail polish No metal in your hair or on your body. Do not wear lotions, powders, perfumes.  You may wear deodorant.  Do not bring valuables to the hospital. Contacts, dentures or bridgework may not be worn into surgery.  Leave suitcase in the car. After Surgery it may be brought to your room. For patients being admitted to the hospital, checkout time is 11:00am the day of discharge.  Home with husband Stacy Boyer cell [phone]

## 2014-02-23 ENCOUNTER — Encounter (HOSPITAL_COMMUNITY)
Admission: RE | Admit: 2014-02-23 | Discharge: 2014-02-23 | Disposition: A | Discharge status: Home / Self Care | Payer: 59 | Source: Ambulatory Visit | Attending: Obstetrics and Gynecology | Admitting: Obstetrics and Gynecology

## 2014-02-23 ENCOUNTER — Encounter (HOSPITAL_COMMUNITY): Payer: Self-pay

## 2014-02-23 DIAGNOSIS — Z01812 Encounter for preprocedural laboratory examination: Secondary | ICD-10-CM | POA: Insufficient documentation

## 2014-02-23 HISTORY — DX: Anemia, unspecified: D64.9

## 2014-02-23 HISTORY — DX: Unspecified osteoarthritis, unspecified site: M19.90

## 2014-02-23 LAB — CBC
HEMATOCRIT: 40.2 % (ref 36.0–46.0)
HEMOGLOBIN: 13.8 g/dL (ref 12.0–15.0)
MCH: 30.3 pg (ref 26.0–34.0)
MCHC: 34.3 g/dL (ref 30.0–36.0)
MCV: 88.2 fL (ref 78.0–100.0)
Platelets: 222 10*3/uL (ref 150–400)
RBC: 4.56 MIL/uL (ref 3.87–5.11)
RDW: 12.8 % (ref 11.5–15.5)
WBC: 6.6 10*3/uL (ref 4.0–10.5)

## 2014-02-23 LAB — COMPREHENSIVE METABOLIC PANEL
ALK PHOS: 109 U/L (ref 39–117)
ALT: 45 U/L — ABNORMAL HIGH (ref 0–35)
ANION GAP: 9 (ref 5–15)
AST: 34 U/L (ref 0–37)
Albumin: 3.6 g/dL (ref 3.5–5.2)
BUN: 14 mg/dL (ref 6–23)
CALCIUM: 9.8 mg/dL (ref 8.4–10.5)
CO2: 28 mEq/L (ref 19–32)
CREATININE: 0.95 mg/dL (ref 0.50–1.10)
Chloride: 103 mEq/L (ref 96–112)
GFR calc non Af Amer: 69 mL/min — ABNORMAL LOW (ref >90)
GFR, EST AFRICAN AMERICAN: 80 mL/min — AB (ref >90)
GLUCOSE: 92 mg/dL (ref 70–99)
Potassium: 4.5 mEq/L (ref 3.7–5.3)
Sodium: 140 mEq/L (ref 137–147)
TOTAL PROTEIN: 7.4 g/dL (ref 6.0–8.3)
Total Bilirubin: 0.3 mg/dL (ref 0.3–1.2)

## 2014-02-26 ENCOUNTER — Encounter (HOSPITAL_COMMUNITY): Payer: Self-pay

## 2014-03-04 ENCOUNTER — Encounter (HOSPITAL_COMMUNITY): Payer: Self-pay | Admitting: Anesthesiology

## 2014-03-04 MED ORDER — DEXTROSE 5 % IV SOLN
2.0000 g | INTRAVENOUS | Status: AC
  Administered 2014-03-05: 2 g via INTRAVENOUS
  Filled 2014-03-04: qty 2

## 2014-03-04 NOTE — H&P (Signed)
GYNECOLOGY PRE-OP VISIT  HPI: 48 y.o. Married Caucasian female  [phone] with Patient's last menstrual period was 12/11/2013.  here for Surgical discussion. Pre-op for ROBOTIC ASSISTED TOTAL HYSTERECTOMY WITH BILATERAL SALPINGO OOPHORECTOMY WITH PELVIC WASHINGS CYSTOSCOPY  Patient is status post hysteroscopic resection of endometrial polyp using Myosure, dilation and curettage for abnormal uterine bleeding on 01/16/14. 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 No evidence of malignancy.  Patient has had no further bleeding.   Pelvic ultrasound 01/04/14 - Endometrium 12.97 mm with blood flow to area. Anterior right fibroid 1.79 mm.  Ovaries normal.  No free fluid.  Sonohysterogram showed 23 x 26 mm intracavitary filling defect.  Endometrial biopsy 01/04/14 - blood and fragments of hypoproliferatve endometrium with tubal metaplasia. No hyperplasia or malignancy.   GYNECOLOGIC HISTORY: Patient's last menstrual period was 12/11/2013. Mammogram 05/10/12 - normal, BI-RADS1. Pap - 04/13/11 - normal.  No history of abnormal paps.  Birth control - vasectomy.    OB History   Grav Para Term Preterm Abortions TAB SAB Ect Mult Living   3 2   1  1   2        Patient Active Problem List   Diagnosis Date Noted  . Obesity (BMI 30.0-34.9) 06/01/2012  . Irregular menses 06/01/2012  . Elevated LFTs 05/04/2011  . Mixed hyperlipidemia 05/04/2011  . 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 Allergic rhinitis, cause unspecified 04/13/2011    Past Medical History  Diagnosis Date  . Allergy     seasonal and dust  . Herpes simplex labialis   . Mixed hyperlipidemia   . Other abnormal blood chemistry     Past Surgical History  Procedure Laterality Date  . Tonsillectomy  age 29  . Refractive surgery    . Dilation and curettage of uterus  1995     miscarrige  . Cesarean section      Current Outpatient Prescriptions  Medication Sig Dispense Refill  . b complex vitamins tablet Take 1 tablet by mouth daily.    . cetirizine (ZYRTEC) 10 MG tablet Take 10 mg by mouth daily.    . naproxen sodium (ANAPROX) 550 MG tablet Take 1 tablet (550 mg total) by mouth 2 (two) times daily with a meal. Takes as needed for pain. 30 tablet 0  . Omega-3 Fatty Acids (FISH OIL) 500 MG CAPS Take 1 capsule by mouth daily. MegaRed    . valACYclovir (VALTREX) 1000 MG tablet Take 2 tablets at onset of cold sore. Repeat dose of additional 2 tablets in 12 hours (4 tablets per episode) 30 tablet 1   No current facility-administered medications for this visit.     ALLERGIES: Lactose intolerance (gi)  Family History  Problem Relation Age of Onset  . Hyperlipidemia Mother   . Diabetes Mother   . Melanoma Mother   . Arthritis Mother   . Arthritis Father     rheumatoid  . Diabetes Father   . Heart disease Father 52    MI, s/p CABG  . Hypertension Father   . Hyperlipidemia Father   . Atrial fibrillation Father   . Diabetes Brother     resolved after gastric bypass/weight loss   . Hypertension Brother     off meds s/p weight loss  . Cancer Paternal Grandmother     breast cancer in 10's  . Heart disease Maternal Grandmother     64 or younger?  . Stroke Maternal Grandmother  53's  . Cancer Paternal Aunt     breast cancer in 2 paternal aunts    History   Social History  . Marital Status: Married    Spouse Name: N/A    Number of Children: 2  . Years of Education: N/A   Occupational History  . Program Chair at [employer]    Social History Main Topics  . Smoking status: Never Smoker   . Smokeless tobacco: Never Used  . Alcohol Use: 0.5 oz/week    1 drink(s) per  week     Comment: less than one drink per week.  . Drug Use: No  . Sexual Activity: Yes    Partners: Male     Comment: husband had vasectomy   Other Topics Concern  . Not on file   Social History Narrative   Lives with 2 sons, husband and 3 dogs    ROS: Pertinent items are noted in HPI.  PHYSICAL EXAMINATION:   BP 130/80  Pulse 72  Ht 5' 5.5" (1.664 m)  Wt 217 lb (98.431 kg)  BMI 35.55 kg/m2  LMP 12/11/2013   General appearance: alert, cooperative and appears stated age Lungs: clear to auscultation bilaterally Heart: regular rate and rhythm Abdomen: Pfannenstiel incision, soft, non-tender; no masses, no organomegaly No abnormal inguinal nodes palpated  Pelvic: External genitalia: no lesions  Urethra: normal appearing urethra with no masses, tenderness or lesions  Bartholins and Skenes: normal   Vagina: normal appearing vagina with normal color and discharge, no lesions  Cervix: normal appearance. No uterine decensus.    Bimanual Exam: Uterus: uterus is normal size, shape, consistency and nontender  Adnexa: normal adnexa in size, nontender and no masses   ASSESSMENT  Complex endometrial hyperplasia with atypia noted in a polyp at time of hysteroscopic resection. Small uterine fibroid.   PLAN  Proceed with robotic total laparoscopic hysterectomy with bilateral salpingo-oophorectomy, cystoscopy, collection of pelvic washings. Risks, benefits, and alternatives discussed with patient who wishes to proceed. Risks include but are not limited to bleeding, infection, damage to surrounding organs, DVT, PE, death, reaction to anesthesia, pneumonia, unanticipated diagnosis of cancer requiring further surgical and other medical care, menopausal symptoms that can be treated.  Watched [facility] video on  hysterectomy. Has received written materials on hysterectomy and watched DVD on robotic hysterectomy also. Indicates understanding of diagnosis, procedure, surgical expectations, and recovery.   An After Visit Summary was printed and given to the patient.  __25____ minutes face to face time of which over 50% was spent in counseling.

## 2014-03-04 NOTE — Anesthesia Preprocedure Evaluation (Addendum)
Anesthesia Evaluation  Patient identified by MRN, date of birth, ID band Patient awake    Reviewed: Allergy & Precautions, H&P , NPO status , Patient's Chart, lab work & pertinent test results  Airway Mallampati: II  TM Distance: >3 FB Neck ROM: Full    Dental no notable dental hx. (+) Teeth Intact   Pulmonary neg pulmonary ROS,  breath sounds clear to auscultation  Pulmonary exam normal       Cardiovascular negative cardio ROS  Rhythm:Regular Rate:Normal     Neuro/Psych negative neurological ROS  negative psych ROS   GI/Hepatic Neg liver ROS, GERD-  Medicated and Controlled,  Endo/Other  Obesity  Renal/GU negative Renal ROS  negative genitourinary   Musculoskeletal  (+) Arthritis -,   Abdominal (+) + obese,   Peds  Hematology  (+) anemia ,   Anesthesia Other Findings   Reproductive/Obstetrics HSV Complex atypical endometrial hyperplasia                            Anesthesia Physical Anesthesia Plan  ASA: II  Anesthesia Plan: General   Post-op Pain Management:    Induction: Intravenous  Airway Management Planned: Oral ETT  Additional Equipment:   Intra-op Plan:   Post-operative Plan: Extubation in OR  Informed Consent: I have reviewed the patients History and Physical, chart, labs and discussed the procedure including the risks, benefits and alternatives for the proposed anesthesia with the patient or authorized representative who has indicated his/her understanding and acceptance.   Dental advisory given  Plan Discussed with: CRNA, Anesthesiologist and Surgeon  Anesthesia Plan Comments:         Anesthesia Quick Evaluation

## 2014-03-05 ENCOUNTER — Encounter (HOSPITAL_COMMUNITY): Payer: Self-pay

## 2014-03-05 ENCOUNTER — Ambulatory Visit (HOSPITAL_COMMUNITY): Payer: 59 | Admitting: Anesthesiology

## 2014-03-05 ENCOUNTER — Encounter (HOSPITAL_COMMUNITY)
Admission: RE | Disposition: A | Discharge status: Home / Self Care | Payer: Self-pay | Source: Ambulatory Visit | Attending: Obstetrics and Gynecology

## 2014-03-05 ENCOUNTER — Observation Stay (HOSPITAL_COMMUNITY)
Admission: RE | Admit: 2014-03-05 | Discharge: 2014-03-06 | Disposition: A | Discharge status: Home / Self Care | Payer: 59 | Source: Ambulatory Visit | Attending: Obstetrics and Gynecology | Admitting: Obstetrics and Gynecology

## 2014-03-05 DIAGNOSIS — E782 Mixed hyperlipidemia: Secondary | ICD-10-CM | POA: Insufficient documentation

## 2014-03-05 DIAGNOSIS — N8502 Endometrial intraepithelial neoplasia [EIN]: Secondary | ICD-10-CM

## 2014-03-05 DIAGNOSIS — E739 Lactose intolerance, unspecified: Secondary | ICD-10-CM | POA: Diagnosis not present

## 2014-03-05 DIAGNOSIS — D649 Anemia, unspecified: Secondary | ICD-10-CM | POA: Diagnosis not present

## 2014-03-05 DIAGNOSIS — E669 Obesity, unspecified: Secondary | ICD-10-CM | POA: Insufficient documentation

## 2014-03-05 DIAGNOSIS — Z79899 Other long term (current) drug therapy: Secondary | ICD-10-CM | POA: Insufficient documentation

## 2014-03-05 DIAGNOSIS — Z6830 Body mass index (BMI) 30.0-30.9, adult: Secondary | ICD-10-CM | POA: Insufficient documentation

## 2014-03-05 DIAGNOSIS — J309 Allergic rhinitis, unspecified: Secondary | ICD-10-CM | POA: Diagnosis not present

## 2014-03-05 DIAGNOSIS — D259 Leiomyoma of uterus, unspecified: Secondary | ICD-10-CM | POA: Insufficient documentation

## 2014-03-05 DIAGNOSIS — Z90710 Acquired absence of both cervix and uterus: Secondary | ICD-10-CM | POA: Diagnosis present

## 2014-03-05 HISTORY — PX: ROBOTIC ASSISTED TOTAL HYSTERECTOMY WITH BILATERAL SALPINGO OOPHERECTOMY: SHX6086

## 2014-03-05 HISTORY — PX: CYSTOSCOPY: SHX5120

## 2014-03-05 LAB — PREGNANCY, URINE: PREG TEST UR: NEGATIVE

## 2014-03-05 SURGERY — ROBOTIC ASSISTED TOTAL HYSTERECTOMY WITH BILATERAL SALPINGO OOPHORECTOMY
Anesthesia: General | Site: Urethra

## 2014-03-05 MED ORDER — PROPOFOL 10 MG/ML IV BOLUS
INTRAVENOUS | Status: DC | PRN
  Administered 2014-03-05: 200 mg via INTRAVENOUS

## 2014-03-05 MED ORDER — PROPOFOL 10 MG/ML IV EMUL
INTRAVENOUS | Status: AC
  Filled 2014-03-05: qty 40

## 2014-03-05 MED ORDER — MENTHOL 3 MG MT LOZG: 1.0000 | LOZENGE | OROMUCOSAL | Status: DC | PRN

## 2014-03-05 MED ORDER — KETOROLAC TROMETHAMINE 30 MG/ML IJ SOLN
30.0000 mg | Freq: Four times a day (QID) | INTRAMUSCULAR | Status: DC
  Administered 2014-03-05: 30 mg via INTRAVENOUS
  Filled 2014-03-05: qty 1

## 2014-03-05 MED ORDER — SODIUM CHLORIDE 0.9 % IJ SOLN
INTRAMUSCULAR | Status: AC
  Filled 2014-03-05: qty 10

## 2014-03-05 MED ORDER — KETOROLAC TROMETHAMINE 30 MG/ML IJ SOLN
INTRAMUSCULAR | Status: AC
  Filled 2014-03-05: qty 1

## 2014-03-05 MED ORDER — DIPHENHYDRAMINE HCL 12.5 MG/5ML PO ELIX: 12.5000 mg | ORAL_SOLUTION | Freq: Four times a day (QID) | ORAL | Status: DC | PRN

## 2014-03-05 MED ORDER — HYDROMORPHONE HCL 1 MG/ML IJ SOLN
INTRAMUSCULAR | Status: AC
  Filled 2014-03-05: qty 1

## 2014-03-05 MED ORDER — NALOXONE HCL 0.4 MG/ML IJ SOLN: 0.4000 mg | INTRAMUSCULAR | Status: DC | PRN

## 2014-03-05 MED ORDER — SODIUM CHLORIDE 0.9 % IV SOLN
INTRAVENOUS | Status: DC | PRN
  Administered 2014-03-05: 60 mL

## 2014-03-05 MED ORDER — BUPIVACAINE HCL (PF) 0.25 % IJ SOLN
INTRAMUSCULAR | Status: AC
  Filled 2014-03-05: qty 30

## 2014-03-05 MED ORDER — OXYCODONE-ACETAMINOPHEN 5-325 MG PO TABS
1.0000 | ORAL_TABLET | ORAL | Status: DC | PRN
  Administered 2014-03-05 – 2014-03-06 (×2): 1 via ORAL
  Filled 2014-03-05 (×2): qty 1

## 2014-03-05 MED ORDER — FENTANYL CITRATE 0.05 MG/ML IJ SOLN
INTRAMUSCULAR | Status: AC
  Filled 2014-03-05: qty 5

## 2014-03-05 MED ORDER — NEOSTIGMINE METHYLSULFATE 10 MG/10ML IV SOLN
INTRAVENOUS | Status: DC | PRN
  Administered 2014-03-05: 3 mg via INTRAVENOUS

## 2014-03-05 MED ORDER — LACTATED RINGERS IV SOLN
INTRAVENOUS | Status: DC
  Administered 2014-03-05 (×2): via INTRAVENOUS

## 2014-03-05 MED ORDER — KETOTIFEN FUMARATE 0.025 % OP SOLN
1.0000 [drp] | Freq: Two times a day (BID) | OPHTHALMIC | Status: DC
  Filled 2014-03-05: qty 5

## 2014-03-05 MED ORDER — HYDROMORPHONE HCL 1 MG/ML IJ SOLN
INTRAMUSCULAR | Status: DC | PRN
  Administered 2014-03-05 (×2): 0.5 mg via INTRAVENOUS

## 2014-03-05 MED ORDER — SCOPOLAMINE 1 MG/3DAYS TD PT72
1.0000 | MEDICATED_PATCH | TRANSDERMAL | Status: DC
  Administered 2014-03-05: 1.5 mg via TRANSDERMAL

## 2014-03-05 MED ORDER — LACTATED RINGERS IV SOLN
INTRAVENOUS | Status: DC
  Administered 2014-03-05: 08:00:00 via INTRAVENOUS
  Administered 2014-03-05: 125 mL/h via INTRAVENOUS

## 2014-03-05 MED ORDER — ROCURONIUM BROMIDE 100 MG/10ML IV SOLN
INTRAVENOUS | Status: DC | PRN
  Administered 2014-03-05 (×3): 10 mg via INTRAVENOUS
  Administered 2014-03-05: 20 mg via INTRAVENOUS
  Administered 2014-03-05 (×3): 10 mg via INTRAVENOUS
  Administered 2014-03-05: 40 mg via INTRAVENOUS
  Administered 2014-03-05 (×2): 10 mg via INTRAVENOUS

## 2014-03-05 MED ORDER — MORPHINE SULFATE 4 MG/ML IJ SOLN: 1.0000 mg | INTRAMUSCULAR | Status: DC | PRN

## 2014-03-05 MED ORDER — SCOPOLAMINE 1 MG/3DAYS TD PT72
MEDICATED_PATCH | TRANSDERMAL | Status: AC
  Filled 2014-03-05: qty 1

## 2014-03-05 MED ORDER — MEPERIDINE HCL 25 MG/ML IJ SOLN: 6.2500 mg | INTRAMUSCULAR | Status: DC | PRN

## 2014-03-05 MED ORDER — METOCLOPRAMIDE HCL 5 MG/ML IJ SOLN: 10.0000 mg | Freq: Once | INTRAMUSCULAR | Status: DC | PRN

## 2014-03-05 MED ORDER — IBUPROFEN 600 MG PO TABS
600.0000 mg | ORAL_TABLET | Freq: Four times a day (QID) | ORAL | Status: DC | PRN
  Administered 2014-03-06: 600 mg via ORAL
  Filled 2014-03-05: qty 1

## 2014-03-05 MED ORDER — MORPHINE SULFATE (PF) 1 MG/ML IV SOLN
INTRAVENOUS | Status: DC
  Administered 2014-03-05: 13:00:00 via INTRAVENOUS
  Administered 2014-03-05: 5 mg via INTRAVENOUS
  Filled 2014-03-05: qty 25

## 2014-03-05 MED ORDER — HEPARIN SODIUM (PORCINE) 5000 UNIT/ML IJ SOLN
INTRAMUSCULAR | Status: AC
  Filled 2014-03-05: qty 1

## 2014-03-05 MED ORDER — MIDAZOLAM HCL 2 MG/2ML IJ SOLN
INTRAMUSCULAR | Status: AC
  Filled 2014-03-05: qty 2

## 2014-03-05 MED ORDER — KETOROLAC TROMETHAMINE 30 MG/ML IJ SOLN
INTRAMUSCULAR | Status: DC | PRN
  Administered 2014-03-05: 30 mg via INTRAVENOUS

## 2014-03-05 MED ORDER — BUPIVACAINE HCL (PF) 0.25 % IJ SOLN
INTRAMUSCULAR | Status: DC | PRN
  Administered 2014-03-05: 11 mL

## 2014-03-05 MED ORDER — MIDAZOLAM HCL 2 MG/2ML IJ SOLN
INTRAMUSCULAR | Status: DC | PRN
  Administered 2014-03-05: 2 mg via INTRAVENOUS

## 2014-03-05 MED ORDER — SODIUM CHLORIDE 0.9 % IJ SOLN
INTRAMUSCULAR | Status: AC
  Filled 2014-03-05: qty 50

## 2014-03-05 MED ORDER — FENTANYL CITRATE 0.05 MG/ML IJ SOLN
INTRAMUSCULAR | Status: DC | PRN
  Administered 2014-03-05: 100 ug via INTRAVENOUS
  Administered 2014-03-05 (×2): 50 ug via INTRAVENOUS

## 2014-03-05 MED ORDER — HYDROMORPHONE HCL 1 MG/ML IJ SOLN
0.5000 mg | INTRAMUSCULAR | Status: DC | PRN
  Administered 2014-03-05 (×2): 0.5 mg via INTRAVENOUS

## 2014-03-05 MED ORDER — ONDANSETRON HCL 4 MG/2ML IJ SOLN
INTRAMUSCULAR | Status: AC
  Filled 2014-03-05: qty 2

## 2014-03-05 MED ORDER — DOCUSATE SODIUM 100 MG PO CAPS
100.0000 mg | ORAL_CAPSULE | Freq: Two times a day (BID) | ORAL | Status: DC
  Administered 2014-03-05: 100 mg via ORAL
  Filled 2014-03-05: qty 1

## 2014-03-05 MED ORDER — DIPHENHYDRAMINE HCL 50 MG/ML IJ SOLN: 12.5000 mg | Freq: Four times a day (QID) | INTRAMUSCULAR | Status: DC | PRN

## 2014-03-05 MED ORDER — LACTATED RINGERS IV SOLN: INTRAVENOUS | Status: DC

## 2014-03-05 MED ORDER — LORATADINE 10 MG PO TABS
10.0000 mg | ORAL_TABLET | Freq: Every day | ORAL | Status: DC
  Filled 2014-03-05 (×2): qty 1

## 2014-03-05 MED ORDER — SCOPOLAMINE 1 MG/3DAYS TD PT72: 1.0000 | MEDICATED_PATCH | Freq: Once | TRANSDERMAL | Status: DC

## 2014-03-05 MED ORDER — ONDANSETRON HCL 4 MG/2ML IJ SOLN: 4.0000 mg | Freq: Four times a day (QID) | INTRAMUSCULAR | Status: DC | PRN

## 2014-03-05 MED ORDER — ROCURONIUM BROMIDE 100 MG/10ML IV SOLN
INTRAVENOUS | Status: AC
  Filled 2014-03-05: qty 1

## 2014-03-05 MED ORDER — LIDOCAINE HCL (CARDIAC) 20 MG/ML IV SOLN
INTRAVENOUS | Status: DC | PRN
  Administered 2014-03-05: 80 mg via INTRAVENOUS

## 2014-03-05 MED ORDER — ONDANSETRON HCL 4 MG PO TABS: 4.0000 mg | ORAL_TABLET | Freq: Four times a day (QID) | ORAL | Status: DC | PRN

## 2014-03-05 MED ORDER — HYDROMORPHONE HCL 1 MG/ML IJ SOLN
INTRAMUSCULAR | Status: AC
  Administered 2014-03-05: 0.5 mg via INTRAVENOUS
  Filled 2014-03-05: qty 1

## 2014-03-05 MED ORDER — GLYCOPYRROLATE 0.2 MG/ML IJ SOLN
INTRAMUSCULAR | Status: AC
  Filled 2014-03-05: qty 3

## 2014-03-05 MED ORDER — DEXAMETHASONE SODIUM PHOSPHATE 4 MG/ML IJ SOLN
INTRAMUSCULAR | Status: AC
  Filled 2014-03-05: qty 1

## 2014-03-05 MED ORDER — DEXAMETHASONE SODIUM PHOSPHATE 10 MG/ML IJ SOLN
INTRAMUSCULAR | Status: DC | PRN
  Administered 2014-03-05: 4 mg via INTRAVENOUS

## 2014-03-05 MED ORDER — ONDANSETRON HCL 4 MG/2ML IJ SOLN
INTRAMUSCULAR | Status: DC | PRN
  Administered 2014-03-05: 4 mg via INTRAVENOUS

## 2014-03-05 MED ORDER — ROPIVACAINE HCL 5 MG/ML IJ SOLN
INTRAMUSCULAR | Status: AC
  Filled 2014-03-05: qty 60

## 2014-03-05 MED ORDER — ARTIFICIAL TEARS OP OINT
TOPICAL_OINTMENT | OPHTHALMIC | Status: AC
  Filled 2014-03-05: qty 3.5

## 2014-03-05 MED ORDER — NEOSTIGMINE METHYLSULFATE 10 MG/10ML IV SOLN
INTRAVENOUS | Status: AC
  Filled 2014-03-05: qty 1

## 2014-03-05 MED ORDER — SODIUM CHLORIDE 0.9 % IJ SOLN: 9.0000 mL | INTRAMUSCULAR | Status: DC | PRN

## 2014-03-05 MED ORDER — GLYCOPYRROLATE 0.2 MG/ML IJ SOLN
INTRAMUSCULAR | Status: DC | PRN
  Administered 2014-03-05: 0.6 mg via INTRAVENOUS

## 2014-03-05 MED ORDER — LACTATED RINGERS IR SOLN
Status: DC | PRN
  Administered 2014-03-05: 3000 mL

## 2014-03-05 SURGICAL SUPPLY — 64 items
ADH SKN CLS APL DERMABOND .7 (GAUZE/BANDAGES/DRESSINGS) ×2
BARRIER ADHS 3X4 INTERCEED (GAUZE/BANDAGES/DRESSINGS) ×3 IMPLANT
BRR ADH 4X3 ABS CNTRL BYND (GAUZE/BANDAGES/DRESSINGS) ×1
CANISTER SUCT 3000ML (MISCELLANEOUS) ×3 IMPLANT
CATH FOLEY 3WAY  5CC 16FR (CATHETERS) ×1
CATH FOLEY 3WAY 5CC 16FR (CATHETERS) ×2 IMPLANT
CLOTH BEACON ORANGE TIMEOUT ST (SAFETY) ×3 IMPLANT
CONT PATH 16OZ SNAP LID 3702 (MISCELLANEOUS) ×6 IMPLANT
COVER BACK TABLE 60X90IN (DRAPES) ×12 IMPLANT
COVER TIP SHEARS 8 DVNC (MISCELLANEOUS) ×2 IMPLANT
COVER TIP SHEARS 8MM DA VINCI (MISCELLANEOUS) ×1
DECANTER SPIKE VIAL GLASS SM (MISCELLANEOUS) ×6 IMPLANT
DERMABOND ADVANCED (GAUZE/BANDAGES/DRESSINGS) ×1
DERMABOND ADVANCED .7 DNX12 (GAUZE/BANDAGES/DRESSINGS) ×2 IMPLANT
DRAPE HUG U DISPOSABLE (DRAPE) ×3 IMPLANT
DRAPE HYSTEROSCOPY (DRAPE) IMPLANT
DRAPE WARM FLUID 44X44 (DRAPE) ×3 IMPLANT
DRSG COVADERM PLUS 2X2 (GAUZE/BANDAGES/DRESSINGS) ×3 IMPLANT
DRSG OPSITE POSTOP 3X4 (GAUZE/BANDAGES/DRESSINGS) ×3 IMPLANT
DURAPREP 26ML APPLICATOR (WOUND CARE) ×3 IMPLANT
ELECT REM PT RETURN 9FT ADLT (ELECTROSURGICAL) ×3
ELECTRODE REM PT RTRN 9FT ADLT (ELECTROSURGICAL) ×2 IMPLANT
EVACUATOR SMOKE 8.L (FILTER) ×3 IMPLANT
GAUZE VASELINE 3X9 (GAUZE/BANDAGES/DRESSINGS) IMPLANT
GLOVE BIO SURGEON STRL SZ 6.5 (GLOVE) ×6 IMPLANT
GLOVE BIOGEL PI IND STRL 6.5 (GLOVE) ×2 IMPLANT
GLOVE BIOGEL PI IND STRL 7.0 (GLOVE) ×4 IMPLANT
GLOVE BIOGEL PI INDICATOR 6.5 (GLOVE) ×1
GLOVE BIOGEL PI INDICATOR 7.0 (GLOVE) ×2
GOWN STRL REUS W/TWL LRG LVL3 (GOWN DISPOSABLE) ×30 IMPLANT
KIT ACCESSORY DA VINCI DISP (KITS) ×1
KIT ACCESSORY DVNC DISP (KITS) ×2 IMPLANT
LEGGING LITHOTOMY PAIR STRL (DRAPES) ×3 IMPLANT
LIQUID BAND (GAUZE/BANDAGES/DRESSINGS) ×3 IMPLANT
OCCLUDER COLPOPNEUMO (BALLOONS) ×3 IMPLANT
PACK ROBOT WH (CUSTOM PROCEDURE TRAY) ×3 IMPLANT
PACK VAGINAL WOMENS (CUSTOM PROCEDURE TRAY) ×3 IMPLANT
PAD PREP 24X48 CUFFED NSTRL (MISCELLANEOUS) ×6 IMPLANT
PAD TRENDELENBURG POSITION (MISCELLANEOUS) ×3 IMPLANT
PROTECTOR NERVE ULNAR (MISCELLANEOUS) ×6 IMPLANT
SET CYSTO W/LG BORE CLAMP LF (SET/KITS/TRAYS/PACK) ×3 IMPLANT
SET IRRIG TUBING LAPAROSCOPIC (IRRIGATION / IRRIGATOR) ×6 IMPLANT
SUT VIC AB 0 CT2 27 (SUTURE) ×15 IMPLANT
SUT VIC AB 2-0 SH 27 (SUTURE) ×9
SUT VIC AB 2-0 SH 27XBRD (SUTURE) ×6 IMPLANT
SUT VIC AB 2-0 UR6 27 (SUTURE) IMPLANT
SUT VIC AB 4-0 PS2 27 (SUTURE) ×6 IMPLANT
SUT VICRYL 0 UR6 27IN ABS (SUTURE) ×6 IMPLANT
SYR 50ML LL SCALE MARK (SYRINGE) ×6 IMPLANT
SYRINGE 10CC LL (SYRINGE) ×3 IMPLANT
SYSTEM CONVERTIBLE TROCAR (TROCAR) IMPLANT
TIP RUMI ORANGE 6.7MMX12CM (TIP) IMPLANT
TIP UTERINE 5.1X6CM LAV DISP (MISCELLANEOUS) IMPLANT
TIP UTERINE 6.7X10CM GRN DISP (MISCELLANEOUS) ×3 IMPLANT
TIP UTERINE 6.7X6CM WHT DISP (MISCELLANEOUS) IMPLANT
TIP UTERINE 6.7X8CM BLUE DISP (MISCELLANEOUS) IMPLANT
TOWEL OR 17X24 6PK STRL BLUE (TOWEL DISPOSABLE) ×9 IMPLANT
TRAY FOLEY CATH 14FR (SET/KITS/TRAYS/PACK) ×3 IMPLANT
TROCAR DILATING TIP 12MM 150MM (ENDOMECHANICALS) ×3 IMPLANT
TROCAR DISP BLADELESS 8 DVNC (TROCAR) ×2 IMPLANT
TROCAR DISP BLADELESS 8MM (TROCAR) ×1
TROCAR XCEL NON-BLD 5MMX100MML (ENDOMECHANICALS) ×3 IMPLANT
WARMER LAPAROSCOPE (MISCELLANEOUS) ×3 IMPLANT
WATER STERILE IRR 1000ML POUR (IV SOLUTION) ×9 IMPLANT

## 2014-03-05 NOTE — Plan of Care (Signed)
Problem: Phase II Progression Outcomes Goal: Pain controlled on oral analgesia Outcome: Completed/Met Date Met:  03/05/14 Goal: Progress activity as tolerated unless otherwise ordered Outcome: Completed/Met Date Met:  03/05/14 Goal: Afebrile, VS remain stable Outcome: Completed/Met Date Met:  03/05/14 Goal: Foley discontinued Outcome: Completed/Met Date Met:  03/05/14 Goal: Voiding trials/Bladder training within 48 hrs Outcome: Completed/Met Date Met:  03/05/14 Goal: Incision/dressings dry and intact Outcome: Completed/Met Date Met:  03/05/14

## 2014-03-05 NOTE — Transfer of Care (Signed)
Immediate Anesthesia Transfer of Care Note  Patient: Stacy Boyer  Procedure(s) Performed: Procedure(s): ROBOTIC ASSISTED TOTAL HYSTERECTOMY WITH BILATERAL SALPINGO OOPHORECTOMY WITH PELVIC WASHINGS (N/A) CYSTOSCOPY (N/A)  Patient Location: PACU  Anesthesia Type:General  Level of Consciousness: awake, alert , oriented and patient cooperative  Airway & Oxygen Therapy: Patient Spontanous Breathing and Patient connected to nasal cannula oxygen  Post-op Assessment: Report given to PACU RN and Post -op Vital signs reviewed and stable  Post vital signs: Reviewed and stable  Complications: No apparent anesthesia complications

## 2014-03-05 NOTE — Progress Notes (Signed)
Preop note  No marked change in status since office preop visit.  Patient examined.  OK to proceed.

## 2014-03-05 NOTE — Anesthesia Postprocedure Evaluation (Signed)
  Anesthesia Post-op Note  Anesthesia Post Note  Patient: Stacy Boyer  Procedure(s) Performed: Procedure(s) (LRB): ROBOTIC ASSISTED TOTAL HYSTERECTOMY WITH BILATERAL SALPINGO OOPHORECTOMY WITH PELVIC WASHINGS (N/A) CYSTOSCOPY (N/A)  Anesthesia type: General  Patient location: PACU  Post pain: Pain level controlled  Post assessment: Post-op Vital signs reviewed  Last Vitals:  Filed Vitals:   03/05/14 1200  BP: 113/61  Pulse: 76  Temp: 36.9 C  Resp: 28    Post vital signs: Reviewed  Level of consciousness: sedated  Complications: No apparent anesthesia complications

## 2014-03-05 NOTE — Plan of Care (Signed)
Problem: Phase I Progression Outcomes Goal: Pain controlled with appropriate interventions Outcome: Completed/Met Date Met:  03/05/14 Goal: Admission history reviewed Outcome: Completed/Met Date Met:  03/05/14 Goal: Dangle/OOB as tolerated per MD order Outcome: Completed/Met Date Met:  03/05/14 Goal: VS, stable, temp < 100.4 degrees F Outcome: Completed/Met Date Met:  03/05/14 Goal: I & O every 4 hrs or as ordered Outcome: Completed/Met Date Met:  03/05/14 Goal: IS, TCDB as ordered Outcome: Completed/Met Date Met:  03/05/14     

## 2014-03-05 NOTE — Progress Notes (Signed)
Day of Surgery Procedure(s) (LRB): ROBOTIC ASSISTED TOTAL HYSTERECTOMY WITH BILATERAL SALPINGO OOPHORECTOMY WITH PELVIC WASHINGS (N/A) CYSTOSCOPY (N/A)  Subjective: Patient reports ambulating.  Tolerating clear liquids.  Objective: I have reviewed patient's vital signs and intake and output.  General: alert Resp: clear to auscultation bilaterally Cardio: regular rate and rhythm, S1, S2 normal, no murmur, click, rub or gallop GI: soft, non-tender; bowel sounds normal; no masses,  no organomegaly and incision: clean, dry, intact and  abrasions on skin.  Extremities:  PAS and ted hose on.  DPs 2+ bilaterally.  Vaginal Bleeding: none  Assessment: s/p Procedure(s): ROBOTIC ASSISTED TOTAL HYSTERECTOMY WITH BILATERAL SALPINGO OOPHORECTOMY WITH PELVIC WASHINGS (N/A) CYSTOSCOPY (N/A): stable  Plan: Advance diet Encourage ambulation Advance to PO medication  discontinue foley.  CBC and BMP in am.  Reviewed surgical findings and procedure.    LOS: 0 days    Stacy Boyer 03/05/2014, 6:43 PM

## 2014-03-05 NOTE — Addendum Note (Signed)
Addendum  created 03/05/14 1255 by Georgeanne Nim, CRNA   Modules edited: Charges VN

## 2014-03-05 NOTE — Brief Op Note (Signed)
03/05/2014  11:07 AM  PATIENT:  Stacy Boyer  48 y.o. female  PRE-OPERATIVE DIAGNOSIS:  complex atypical hyperplasia  POST-OPERATIVE DIAGNOSIS:  complex atypical hyperplasia  PROCEDURE:  Procedure(s): ROBOTIC ASSISTED TOTAL HYSTERECTOMY WITH BILATERAL SALPINGO OOPHORECTOMY WITH PELVIC WASHINGS (N/A) CYSTOSCOPY (N/A)  SURGEON:  Surgeon(s) and Role:    * Brook E Amundson de Berton Lan, MD - Primary    * Lyman Speller, MD - Assisting  PHYSICIAN ASSISTANT: NA  ASSISTANTS:  Lyman Speller, MD   ANESTHESIA:   General, local Marcaine 0.25%, and intraperitoneal Ropivacaine  EBL:  Total I/O In: 1500 [I.V.:1500] Out: 300 [Urine:200; Blood:100]  BLOOD ADMINISTERED:none  DRAINS: Urinary Catheter (Foley)   LOCAL MEDICATIONS USED:  MARCAINE    and OTHER  Ropivacaine   SPECIMEN:  Source of Specimen:   Uterus, cervix, bilateral tubes and ovaries.  DISPOSITION OF SPECIMEN:  PATHOLOGY  COUNTS:  YES  TOURNIQUET:  * No tourniquets in log *  DICTATION: .Other Dictation: Dictation Number    PLAN OF CARE: Admit for overnight observation  PATIENT DISPOSITION:      Delay start of Pharmacological VTE agent (>24hrs) due to surgical blood loss or risk of bleeding: not applicable

## 2014-03-05 NOTE — Op Note (Signed)
NAMEHAVA, MASSINGALE NO.:  [id number]  MEDICAL RECORD NO.:  [id number]  LOCATION:  9470                          FACILITY:  [facility]  PHYSICIAN:  Lenard Galloway, M.D.   DATE OF BIRTH:  12/13/1965  DATE OF PROCEDURE:  03/05/2014 DATE OF DISCHARGE:                              OPERATIVE REPORT   PREOPERATIVE DIAGNOSIS:  Complex atypical endometrial hyperplasia.  POSTOPERATIVE DIAGNOSIS:  Complex atypical endometrial hyperplasia.  PROCEDURES:  Robotic total laparoscopic hysterectomy with bilateral salpingo-oophorectomy, collection of pelvic washings, and cystoscopy.  SURGEON:  Lenard Galloway, M.D.  ASSISTANT:  Felipa Emory, MD  ANESTHESIA:  General endotracheal, local with 0.25% Marcaine and intraperitoneal ropivacaine.  IV FLUIDS:  1500 mL Ringer's lactate.  EBL:  100 mL.  URINE OUTPUT:  200 mL.  COMPLICATIONS:  None.  INDICATIONS FOR THE PROCEDURE:  The patient is a 48 year old, gravida 3, para 2 Caucasian female, who presented with abnormal uterine bleeding. The patient underwent hysteroscopic resection of an endometrial polyp on January 16, 2014, and the diagnosis was complex endometrial hyperplasia with atypia.  There was no evidence of a malignancy.  The patient had an ultrasound on January 04, 2014, documenting endometrium measuring 12.97 mm and an anterior fundal fibroid measured 1.79 mm.  The ovaries were normal and there was no evidence of free fluid in the pelvis.  She had a sonohysterogram on the same day, which documented a 23 x 26-mm intracavitary filling defect.  Pre-hysteroscopy endometrial biopsy on January 04, 2014, documented blood and fragments of hypoproliferative endometrium with tubal metaplasia.  There was no evidence of hyperplasia or malignancy at that preoperative endometrial biopsy.  Plan is now made to proceed with a robotic total laparoscopic hysterectomy with bilateral salpingo-oophorectomy and collection  of pelvic washings and cystoscopy.  Risks, benefits, and alternatives have been reviewed with the patient who wishes to proceed.  A clear discussion was held with the patient regarding the possibility of a hidden endometrial cancer at the time of surgery, which could necessitate further evaluation and treatment.  She agrees to proceed with today's procedure.  FINDINGS:  Laparoscopy demonstrated a small uterus with a 2-cm anterior fundal fibroid.  There was scarring along the lower uterine segment consistent with prior cesarean section.  The bilateral tubes and ovaries were unremarkable.  The serosal surface of the uterus was unremarkable. There was no evidence of excrescences or papillations in the abdomen or the pelvis.  There was no fluid in the pelvis.  The upper abdomen was unremarkable.  Cystoscopy at termination of the procedure documented the absence of a foreign body in the bladder or the urethra.  There was no evidence of cystotomy nor suture material appreciated.  The bladder was visualized throughout 360 degrees including the bladder dome and trigone.  The ureters were noted to be patent bilaterally.  SPECIMEN:  The uterus, cervix, bilateral tubes and bilateral ovaries were Sent to pathology separately from the pelvic washings.   DESCRIPTION OF PROCEDURE:  The patient was reidentified in the preoperative hold area.  She did receive cefotetan 2 g IV and she received TED hose and PAS stockings for DVT prophylaxis.  In the operating room, the patient received  general endotracheal anesthesia.  She was placed in the dorsal lithotomy position with the Allen stirrups.  The patient was appropriately placed on an antislip pad and her arms were carefully tucked at the sides and padded and supported.  The patient also had proper padding placed in the head and neck region.  The abdomen and vagina were sterilely prepped and the patient was sterilely draped.  A Foley catheter was  placed inside the bladder.  A speculum was placed inside the vagina and a tenaculum was placed on the anterior cervical lip.  A figure-of-eight suture of 0 Vicryl was placed on the cervix at the 12 o'clock position and the 6 o'clock position.  The suture was sounded to 10 cm.  The RUMI with a medium-sized KOH ring was then placed on the cervix and in the uterine cavity and the balloon was inflated. All remaining vaginal instruments were removed.  Attention was turned to the abdomen simultaneously during placement of the vaginal instruments.  A small tiny incision was created in the umbilicus, so that the Veress needle could be inserted into the peritoneal cavity.  A saline drop test was performed and the saline flowed freely.  CO2 was introduced into the peritoneal cavity and a pneumoperitoneum was achieved.  After an adequate pneumoperitoneum was achieved, a 00-XF supraumbilical incision was created with a scalpel after injecting locally with 0.25% Marcaine.  A 10-mm trocar was placed inside the abdominal cavity and the laparoscope confirmed proper placement.  The robotic ports were then placed 10 cm to the right and left of the umbilical incision.  The skin was injected again with the Marcaine, the incisions were created sharply, and the trocars were placed under direct visualization of the laparoscope.  An assistant port was placed in the right lower quadrant.  Again, the skin was injected locally with the Marcaine, the incision was created sharply, and the 5- mm trocar was placed under visualization of the laparoscope.  At this time, the patient was placed in the Trendelenburg position.  The robot was docked.  The bipolar cautery instrument was placed in the left upper abdominal port and the monopolar cautery instrument was placed in the right upper robotic port.  The ropivacaine was introduced into the peritoneal cavity.  It was realized that the pelvic washings were not collected  previously and a decision was made to go ahead and to proceed with a collection of the peritoneal washings at this time and this fluid was then sent to Pathology.  I took over at the surgeon's console at this point.  An inspection of the anatomy was performed.  The left ureter was identified.  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  A combination of sharp dissection and monopolar cautery were used to take this small congenital adhesion down. The left infundibulopelvic ligament was identified at this point.  It was grasped with the PK instrument was cauterized multiple times before dividing the pedicle with the monopolar cautery instrument.  Continued dissection through the broad ligament was performed at this time using the PK instrument and the monopolar instrument for cautery and cutting. Eventually, the round ligament on the patient's left-hand side was reached and was triply cauterized with the bipolar PK instrument and then was bisected with the monopolar cautery instrument.  The peritoneum was taken down anteriorly and posteriorly using the laparoscopic scissors and monopolar cautery.  The  dissection was carried across the bladder in the midline and the dissection was performed high in order to avoid cystotomy.  There were a fair amount of dense adhesions in the vesicouterine fold and this required a focused effort to release these adhesions.  This was performed with sharp dissection and with monopolar cautery when it was clear that the dissection was away from the bladder dome.  Attention was turned to the patient's right hand side where the pedicles were taken down as along the patient's left hand side.  The right ureter was carefully identified.  Therefore, the right infundibulopelvic ligament was cauterized and cut.  The same techniques were used  on the right-hand side as were used on the left-hand side.  At this point, the right uterine artery was skeletonized well and then the PK instrument was used to cauterize multiple times before cutting with the laparoscopic scissors.  Please note that just prior to this, the bladder was retrograde filled with the 3-way Foley catheter so that the outline of the bladder could be identified.  The bladder was noted to be in a good position and away from the intended colpotomy incision.  Attention was turned to the left uterine artery, which was multiply cauterized and then cut for good hemostasis.  At this point, the bladder was noted to be empty and well away from the colpotomy incision and the KOH ring was well identified and a circumferential incision was created with monopolar cautery on the KOH ring.  The specimen was then completely freed and was removed from within the peritoneal cavity.  The balloon was then inflated in the vagina.  The Cobra and the suture cut instrument were then used to place a 0 V- Loc suture.  The closure of the vagina began on the patient's right-hand side and then extended to the left placing the V-Loc in a running fashion.  The suture was brought back to the midline to sutures in order to bury the V-Loc.  The pelvis was irrigated at this point and examined, and all of the operative sites were hemostatic.  The vaginal cuff closure suture was cut.  The needle was parked in the right pericolic gutter.  At this point, I went below and removed the patient's Foley catheter and then performed cystoscopy and the findings were as noted above.  The needle from inside the peritoneal cavity was simultaneously removed and the robot was undocked.  The laparoscopic trocars were removed under visualization of the laparoscope.  The patient received manual breaths and then the umbilical trocar was removed last.  The vagina was inspected and there was no evidence of  any bleeding from the vaginal cuff and it was noted to be well closed.  The umbilical fascial incision was closed with a combination of a figure- of-eight suture of 0 Vicryl and then 1 simple suture of 0 Vicryl.  Care was taken to identify the fascia carefully and elevated as the fascia was closed.  All incisions were closed with subcuticular sutures of 4-0 Vicryl.  Dermabond was placed over the incisions and then the incisions were covered with a sterile bandage.  The patient was cleansed of any remaining abdominal prep.  A Foley catheter was replaced and the vagina at the end of the cystoscopy and it was left to gravity drainage.  The patient was awakened and escorted to the recovery room in stable condition.  There were no complications to the procedure.  All needle, instrument, and lap counts were correct.  Lenard Galloway, M.D.     BES/MEDQ  D:  03/05/2014  T:  03/05/2014  Job:  [id number]

## 2014-03-06 ENCOUNTER — Encounter (HOSPITAL_COMMUNITY): Payer: Self-pay | Admitting: Obstetrics and Gynecology

## 2014-03-06 DIAGNOSIS — N8502 Endometrial intraepithelial neoplasia [EIN]: Secondary | ICD-10-CM | POA: Diagnosis not present

## 2014-03-06 LAB — BASIC METABOLIC PANEL
ANION GAP: 10 (ref 5–15)
BUN: 11 mg/dL (ref 6–23)
CO2: 25 mEq/L (ref 19–32)
Calcium: 8.5 mg/dL (ref 8.4–10.5)
Chloride: 106 mEq/L (ref 96–112)
Creatinine, Ser: 0.93 mg/dL (ref 0.50–1.10)
GFR calc non Af Amer: 71 mL/min — ABNORMAL LOW (ref >90)
GFR, EST AFRICAN AMERICAN: 82 mL/min — AB (ref >90)
Glucose, Bld: 91 mg/dL (ref 70–99)
Potassium: 4.1 mEq/L (ref 3.7–5.3)
Sodium: 141 mEq/L (ref 137–147)

## 2014-03-06 LAB — CBC
HCT: 34.8 % — ABNORMAL LOW (ref 36.0–46.0)
Hemoglobin: 11.9 g/dL — ABNORMAL LOW (ref 12.0–15.0)
MCH: 30.4 pg (ref 26.0–34.0)
MCHC: 34.2 g/dL (ref 30.0–36.0)
MCV: 88.8 fL (ref 78.0–100.0)
PLATELETS: 208 10*3/uL (ref 150–400)
RBC: 3.92 MIL/uL (ref 3.87–5.11)
RDW: 13.2 % (ref 11.5–15.5)
WBC: 10.3 10*3/uL (ref 4.0–10.5)

## 2014-03-06 MED ORDER — OXYCODONE-ACETAMINOPHEN 5-325 MG PO TABS: 1.0000 | ORAL_TABLET | ORAL | Status: DC | PRN

## 2014-03-06 MED ORDER — DSS 100 MG PO CAPS: 100.0000 mg | ORAL_CAPSULE | Freq: Two times a day (BID) | ORAL | Status: DC

## 2014-03-06 MED FILL — Heparin Sodium (Porcine) Inj 5000 Unit/ML: INTRAMUSCULAR | Qty: 1 | Status: AC

## 2014-03-06 NOTE — Addendum Note (Signed)
Addendum  created 03/06/14 7048 by Jonna Munro, CRNA   Modules edited: Notes Section   Notes Section:  File: [id number]

## 2014-03-06 NOTE — Discharge Instructions (Signed)
Total Laparoscopic Hysterectomy, Care After °Refer to this sheet in the next few weeks. These instructions provide you with information on caring for yourself after your procedure. Your health care provider may also give you more specific instructions. Your treatment has been planned according to current medical practices, but problems sometimes occur. Call your health care provider if you have any problems or questions after your procedure. °WHAT TO EXPECT AFTER THE PROCEDURE °· Pain and bruising at the incision sites. You will be given pain medicine to control it. °· Menopausal symptoms such as hot flashes, night sweats, and insomnia if your ovaries were removed. °· Sore throat from the breathing tube that was inserted during surgery. °HOME CARE INSTRUCTIONS °· Only take over-the-counter or prescription medicines for pain, discomfort, or fever as directed by your health care provider.   °· Do not take aspirin. It can cause bleeding.   °· Do not drive when taking pain medicine.   °· Follow your health care provider's advice regarding diet, exercise, lifting, driving, and general activities.   °· Resume your usual diet as directed and allowed.   °· Get plenty of rest and sleep.   °· Do not douche, use tampons, or have sexual intercourse for at least 6 weeks, or until your health care provider gives you permission.   °· Change your bandages (dressings) as directed by your health care provider.   °· Monitor your temperature and notify your health care provider of a fever.   °· Take showers instead of baths for 2-3 weeks.   °· Do not drink alcohol until your health care provider gives you permission.   °· If you develop constipation, you may take a mild laxative with your health care provider's permission. Bran foods may help with constipation problems. Drinking enough fluids to keep your urine clear or pale yellow may help as well.   °· Try to have someone home with you for 1-2 weeks to help around the house.    °· Keep all of your follow-up appointments as directed by your health care provider.   °SEEK MEDICAL CARE IF: °· You have swelling, redness, or increasing pain around your incision sites.   °· You have pus coming from your incision.   °· You notice a bad smell coming from your incision.   °· Your incision breaks open.   °· You feel dizzy or lightheaded.   °· You have pain or bleeding when you urinate.   °· You have persistent diarrhea.   °· You have persistent nausea and vomiting.   °· You have abnormal vaginal discharge.   °· You have a rash.   °· You have any type of abnormal reaction or develop an allergy to your medicine.   °· You have poor pain control with your prescribed medicine.   °SEEK IMMEDIATE MEDICAL CARE IF: °· You have chest pain or shortness of breath. °· You have severe abdominal pain that is not relieved with pain medicine. °· You have pain or swelling in your legs. °MAKE SURE YOU: °· Understand these instructions. °· Will watch your condition. °· Will get help right away if you are not doing well or get worse. °Document Released: 02/01/2013 Document Revised: 04/18/2013 Document Reviewed: 02/01/2013 °ExitCare® Patient Information ©2015 [employer]. This information is not intended to replace advice given to you by your health care provider. Make sure you discuss any questions you have with your health care provider. ° °

## 2014-03-06 NOTE — Plan of Care (Signed)
Problem: Discharge Progression Outcomes Goal: Complications resolved/controlled Outcome: Not Applicable Date Met:  14/23/20

## 2014-03-06 NOTE — Anesthesia Postprocedure Evaluation (Signed)
  Anesthesia Post-op Note  Patient: Stacy Boyer  Procedure(s) Performed: Procedure(s): ROBOTIC ASSISTED TOTAL HYSTERECTOMY WITH BILATERAL SALPINGO OOPHORECTOMY WITH PELVIC WASHINGS (N/A) CYSTOSCOPY (N/A)  Patient Location: [facility]  Anesthesia Type:General  Level of Consciousness: awake, alert  and oriented  Airway and Oxygen Therapy: Patient Spontanous Breathing  Post-op Pain: none  Post-op Assessment: Post-op Vital signs reviewed, Patient's Cardiovascular Status Stable, Respiratory Function Stable, No signs of Nausea or vomiting and Adequate PO intake  Post-op Vital Signs: Reviewed and stable  Last Vitals:  Filed Vitals:   03/06/14 0610  BP: 120/66  Pulse: 73  Temp: 37 C  Resp: 18    Complications: No apparent anesthesia complications

## 2014-03-06 NOTE — Plan of Care (Signed)
Problem: Discharge Progression Outcomes Goal: Barriers To Progression Addressed/Resolved Outcome: Not Applicable Date Met:  20/94/70

## 2014-03-06 NOTE — Progress Notes (Signed)
Pt is discharged in the care of husband, with N.T. Escort. Denies pain or discomfort. Spirits are good. Discharged instructions with Rx were given to pt. Questions were asked and answered. Abdominal Lapsites are clean and dry. No equipment neede for home use.

## 2014-03-06 NOTE — Plan of Care (Signed)
Problem: Discharge Progression Outcomes Goal: Discharge plan in place and appropriate Outcome: Completed/Met Date Met:  03/06/14 Goal: Pain controlled with appropriate interventions Outcome: Completed/Met Date Met:  03/06/14 Goal: Hemodynamically stable Outcome: Completed/Met Date Met:  03/06/14 Goal: Tolerating diet Outcome: Completed/Met Date Met:  03/06/14 Goal: Activity appropriate for discharge plan Outcome: Completed/Met Date Met:  03/06/14

## 2014-03-06 NOTE — Progress Notes (Signed)
1 Day Post-Op Procedure(s) (LRB): ROBOTIC ASSISTED TOTAL HYSTERECTOMY WITH BILATERAL SALPINGO OOPHORECTOMY WITH PELVIC WASHINGS (N/A) CYSTOSCOPY (N/A)  Subjective: Patient reports tolerating PO and no problems voiding.    Objective: I have reviewed patient's vital signs, intake and output and labs. Hgb 11.9  General: alert Resp: clear to auscultation bilaterally Cardio: regular rate and rhythm, S1, S2 normal, no murmur, click, rub or gallop GI: soft, non-tender; bowel sounds normal; no masses,  no organomegaly and incision: clean, dry, intact and  ecchymoses umbilical incision. No induration.  Extremities:  Ted hose on.  Vaginal Bleeding: none  Assessment: s/p Procedure(s): ROBOTIC ASSISTED TOTAL HYSTERECTOMY WITH BILATERAL SALPINGO OOPHORECTOMY WITH PELVIC WASHINGS (N/A) CYSTOSCOPY (N/A): progressing well  Plan: Discharge home  Instructions and precautions given.  Rx for Percocet and Motrin.  Follow up in one week or sooner as needed.   LOS: 1 day    Stacy Boyer 03/06/2014, 7:08 AM

## 2014-03-07 ENCOUNTER — Telehealth: Payer: Self-pay

## 2014-03-07 NOTE — Telephone Encounter (Signed)
Left message to call Kaitlyn at [phone]. 

## 2014-03-07 NOTE — Telephone Encounter (Signed)
-----   Message from [place], MD sent at 03/07/2014  1:27 PM EST ----- Please let patient know the good news that her pathology report did not show any endometrial hyperplasia, atypical cells, or cancer!  The hyperplasia she had previously was confined to the polyp I removed at the time of her hysteroscopy. There were fibroids and adenomyosis, which can also contribute to bleeding problems.    The tubes and ovaries were normal.  I will see the patient soon for her first post op check.

## 2014-03-08 NOTE — Telephone Encounter (Signed)
Patient left a message on the after hours answering machine returning call.

## 2014-03-08 NOTE — Telephone Encounter (Signed)
Return call to patient. Advised of pathology results as directed by Dr Quincy Simmonds.  Patient states doing well post op. Feeling better each day with improved mobility and less pain each day. Has post op appointment scheduled for Monday 03-12-14 at 1pm. Encouraged to call PRN.  Routing to provider for final review. Patient agreeable to disposition. Will close encounter

## 2014-03-08 NOTE — Telephone Encounter (Signed)
Pt is returning a call to [place]. Pt states it is to get her results

## 2014-03-12 ENCOUNTER — Telehealth: Payer: Self-pay | Admitting: Obstetrics and Gynecology

## 2014-03-12 ENCOUNTER — Encounter: Payer: Self-pay | Admitting: Obstetrics and Gynecology

## 2014-03-12 ENCOUNTER — Ambulatory Visit (INDEPENDENT_AMBULATORY_CARE_PROVIDER_SITE_OTHER): Payer: 59 | Admitting: Obstetrics and Gynecology

## 2014-03-12 VITALS — BP 120/64 | HR 68 | Resp 18 | Ht 65.5 in | Wt 211.0 lb

## 2014-03-12 DIAGNOSIS — Z90710 Acquired absence of both cervix and uterus: Secondary | ICD-10-CM

## 2014-03-12 NOTE — Progress Notes (Signed)
GYNECOLOGY  VISIT   HPI: 48 y.o.   Married  Caucasian  female   [phone] with No LMP recorded. Patient is not currently having periods (Reason: Other).   here for Post Op. Status post robotic total laparoscopic hysterectomy/BSO/collection pelvic washings/cystoscopy on 03/05/14 Not using pain medication.  Taking Aleve.  No vaginal bleeding.  No hot flashes.   Final pathology - benign endometrium, adenomyosis, leiomyomata, benign cervix, tubes and ovaries.   GYNECOLOGIC HISTORY: No LMP recorded. Patient is not currently having periods (Reason: Other). Contraception:    Menopausal hormone therapy:         OB History    Gravida Para Term Preterm AB TAB SAB Ectopic Multiple Living   3 2   1  1   2          Patient Active Problem List   Diagnosis Date Noted  . Status post laparoscopic hysterectomy 03/05/2014  . Endometrial hyperplasia without atypia, complex 02/10/2014  . Obesity (BMI 30.0-34.9) 06/01/2012  . Irregular menses 06/01/2012  . Elevated LFTs 05/04/2011  . Mixed hyperlipidemia 05/04/2011  . 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 Allergic rhinitis, cause unspecified 04/13/2011    Past Medical History  Diagnosis Date  . Allergy     seasonal and dust  . Herpes simplex labialis   . Mixed hyperlipidemia     diet controlled  . Other abnormal blood chemistry   . SVD (spontaneous vaginal delivery)     x 1  . Arthritis     hands  . Anemia     Hx    Past Surgical History  Procedure Laterality Date  . Tonsillectomy  age 46  . Refractive surgery    . Cesarean section      x 1  . Dilation and curettage of uterus  1995    miscarrige  . Dilation and curettage of uterus  2015  . Eye surgery      Lasik eye surgery  . Wisdom tooth extraction    . Robotic assisted total hysterectomy with bilateral salpingo oopherectomy N/A 03/05/2014    Procedure: ROBOTIC ASSISTED TOTAL HYSTERECTOMY WITH BILATERAL SALPINGO OOPHORECTOMY WITH PELVIC WASHINGS;  Surgeon: Jamey Reas de  Berton Lan, MD;  Location: [facility];  Service: Gynecology;  Laterality: N/A;  . Cystoscopy N/A 03/05/2014    Procedure: CYSTOSCOPY;  Surgeon: Jamey Reas de Berton Lan, MD;  Location: [facility];  Service: Gynecology;  Laterality: N/A;    Current Outpatient Prescriptions  Medication Sig Dispense Refill  . b complex vitamins tablet Take 1 tablet by mouth daily.    . calcium carbonate (TUMS - DOSED IN MG ELEMENTAL CALCIUM) 500 MG chewable tablet Chew 1 tablet by mouth as needed for heartburn (for Calcium).    . cetirizine (ZYRTEC) 10 MG tablet Take 10 mg by mouth daily.    Marland Kitchen docusate sodium 100 MG CAPS Take 100 mg by mouth 2 (two) times daily. 10 capsule 0  . ketotifen (ZADITOR) 0.025 % ophthalmic solution Place 1 drop into both eyes 2 (two) times daily.    . naproxen sodium (ANAPROX) 550 MG tablet Take 1 tablet (550 mg total) by mouth 2 (two) times daily with a meal. Takes as needed for pain. 30 tablet 0  . Omega-3 Fatty Acids (FISH OIL) 500 MG CAPS Take 1 capsule by mouth daily. MegaRed    . oxyCODONE-acetaminophen (PERCOCET/ROXICET) 5-325 MG per tablet Take 1-2 tablets by mouth every 4 (four) hours as needed for severe pain (moderate  to severe pain (when tolerating fluids)). 30 tablet 0  . valACYclovir (VALTREX) 1000 MG tablet Take 2 tablets at onset of cold sore.  Repeat dose of additional 2 tablets in 12 hours (4 tablets per episode) 30 tablet 1   No current facility-administered medications for this visit.     ALLERGIES: Lactose intolerance (gi)  Family History  Problem Relation Age of Onset  . Hyperlipidemia Mother   . Diabetes Mother   . Melanoma Mother   . Arthritis Mother   . Arthritis Father     rheumatoid  . Diabetes Father   . Heart disease Father 38    MI, s/p CABG  . Hypertension Father   . Hyperlipidemia Father   . Atrial fibrillation Father   . Diabetes Brother     resolved after gastric bypass/weight loss   . Hypertension Brother     off meds s/p  weight loss  . Cancer Paternal Grandmother     breast cancer in 72's  . Heart disease Maternal Grandmother     28 or younger?  . Stroke Maternal Grandmother     22's  . Cancer Paternal Aunt     breast cancer in 2 paternal aunts    History   Social History  . Marital Status: Married    Spouse Name: N/A    Number of Children: 2  . Years of Education: N/A   Occupational History  . Program Chair at [employer]    Social History Main Topics  . Smoking status: Never Smoker   . Smokeless tobacco: Never Used  . Alcohol Use: 0.5 oz/week    1 drink(s) per week     Comment: occasional  . Drug Use: No  . Sexual Activity:    Partners: Male    Birth Control/ Protection: None     Comment: husband had vasectomy   Other Topics Concern  . Not on file   Social History Narrative   Lives with 2 sons, husband and 3 dogs    ROS:  Pertinent items are noted in HPI.  PHYSICAL EXAMINATION:    There were no vitals taken for this visit.     General appearance: alert, cooperative and appears stated age   Abdomen: incisions intact with Dermabond, mild ecchymoses, soft, non-tender; no masses,  no organomegaly   ASSESSMENT  Doing well post op.   PLAN  Continue decreased activity.  Will get mammogram and then call to start transdermal estrogen provided mammogram is normal. I discussed benefits and risks of ERT.   Wishes to try the estrogen.    An After Visit Summary was printed and given to the patient.  __20____ minutes face to face time of which over 50% was spent in counseling.

## 2014-03-12 NOTE — Telephone Encounter (Signed)
Stacy Boyer, please check on this. Did she have FMLA forms completed?

## 2014-03-12 NOTE — Patient Instructions (Signed)
Call after your mammogram is done and I can then prescribe estrogen hormones if the mammogram is normal.

## 2014-03-12 NOTE — Telephone Encounter (Signed)
Patient calling requesting a note from Dr. Quincy Simmonds for work stating that she had surgery and the date, what her expected recovery time is, and when she is expected to return to work.

## 2014-03-13 ENCOUNTER — Other Ambulatory Visit: Payer: Self-pay

## 2014-03-13 DIAGNOSIS — Z1231 Encounter for screening mammogram for malignant neoplasm of breast: Secondary | ICD-10-CM

## 2014-03-13 NOTE — Discharge Summary (Signed)
Physician Discharge Summary  Patient ID: Stacy Boyer MRN: [id number] DOB/AGE: Jan 29, 1966 48 y.o.  Admit date: 03/05/2014 Discharge date: 03/06/14  Admission Diagnoses:   1.  Complex endometrial hyperplasia with atypia. 2.  Uterine fibroid.   Discharge Diagnoses:  1.  Complex endometrial hyperplasia with atypia. 2.  Uterine fibroid.  3.  Status post robotic total laparoscopic hysterectomy with bilateral salpingo-oophorectomy, collection of pelvic washings, cystoscopy.   Active Problems:   Status post laparoscopic hysterectomy   Discharged Condition: good  Hospital Course:  The patient was admitted on 03/05/14 for a  robotic total laparoscopic hysterectomy with bilateral salpingo-oophorectomy, collection of pelvic washings, cystoscopy which were performed without complication while under general anesthesia.  The patient's post op course was uneventful.  She had a morphine PCA and Toradol for pain control initially, and this was converted over to Percocet and Motrin on post op zero when the patient began taking a regular diet well.  She ambulated independently and wore PAS and Ted hose for DVT prophylaxis while in bed.  Her foley catheter was removed on post op day zero and she voided good volumes. The patient's vital signs remained stable and she demonstrated no signs of infection during her hospitalization. The patient's post op day one Hgb was 11.9, and she was tolerating this well. She was found to be in good condition and ready for discharge on post op day one.     Consults: None  Significant Diagnostic Studies: labs:  Hgb 11.9.  Treatments: surgery:  robotic total laparoscopic hysterectomy with bilateral salpingo-oophorectomy, collection of pelvic washings, cystoscopy on 03/05/14.  Discharge Exam: Blood pressure 120/66, pulse 73, temperature 98.6 F (37 C), temperature source Oral, resp. rate 18, height 5' 5.5" (1.664 m), weight 217 lb (98.431 kg), SpO2 97 %. General:  alert Resp: clear to auscultation bilaterally Cardio: regular rate and rhythm, S1, S2 normal, no murmur, click, rub or gallop GI: soft, non-tender; bowel sounds normal; no masses, no organomegaly and incision: clean, dry, intact and  ecchymoses umbilical incision. No induration.  Extremities:  Ted hose on.  Vaginal Bleeding: none   Disposition: 01-Home or Self Care  Instructions and precautions reviewed in written and oral form.     Medication List    STOP taking these medications        valACYclovir 1000 MG tablet  Commonly known as:  VALTREX      TAKE these medications        b complex vitamins tablet  Take 1 tablet by mouth daily.     calcium carbonate 500 MG chewable tablet  Commonly known as:  TUMS - dosed in mg elemental calcium  Chew 1 tablet by mouth as needed for heartburn (for Calcium).     cetirizine 10 MG tablet  Commonly known as:  ZYRTEC  Take 10 mg by mouth daily.     Fish Oil 500 MG Caps  Take 1 capsule by mouth daily. MegaRed     ketotifen 0.025 % ophthalmic solution  Commonly known as:  ZADITOR  Place 1 drop into both eyes 2 (two) times daily.     naproxen sodium 550 MG tablet  Commonly known as:  ANAPROX  Take 1 tablet (550 mg total) by mouth 2 (two) times daily with a meal. Takes as needed for pain.     oxyCODONE-acetaminophen 5-325 MG per tablet  Commonly known as:  PERCOCET/ROXICET  Take 1-2 tablets by mouth every 4 (four) hours as needed for severe pain (moderate to severe  pain (when tolerating fluids)).       The patient will follow up in the office in one week, or sooner as needed.  Signed: Arloa Koh 03/13/2014, 10:55 AM

## 2014-03-14 ENCOUNTER — Encounter: Payer: Self-pay | Admitting: Obstetrics and Gynecology

## 2014-03-14 NOTE — Telephone Encounter (Signed)
Stacy Boyer will send note to Dr Elza Rafter office for signature.  Routing to provider for final review. Patient agreeable to disposition. Will close encounter

## 2014-03-14 NOTE — Telephone Encounter (Signed)
Letter signed

## 2014-03-26 DIAGNOSIS — Z0289 Encounter for other administrative examinations: Secondary | ICD-10-CM

## 2014-04-02 ENCOUNTER — Ambulatory Visit (INDEPENDENT_AMBULATORY_CARE_PROVIDER_SITE_OTHER): Payer: 59 | Admitting: Family Medicine

## 2014-04-02 ENCOUNTER — Encounter: Payer: Self-pay | Admitting: Family Medicine

## 2014-04-02 VITALS — BP 130/86 | HR 68 | Temp 98.2°F | Ht 65.0 in | Wt 212.0 lb

## 2014-04-02 DIAGNOSIS — R059 Cough, unspecified: Secondary | ICD-10-CM

## 2014-04-02 DIAGNOSIS — R05 Cough: Secondary | ICD-10-CM

## 2014-04-02 DIAGNOSIS — J029 Acute pharyngitis, unspecified: Secondary | ICD-10-CM

## 2014-04-02 DIAGNOSIS — J069 Acute upper respiratory infection, unspecified: Secondary | ICD-10-CM

## 2014-04-02 LAB — POCT RAPID STREP A (OFFICE): Rapid Strep A Screen: NEGATIVE

## 2014-04-02 NOTE — Patient Instructions (Signed)
  Drink plenty of fluids. Use salt water gargles and/or chloraseptic spray, along with acetaminophen or ibuprofen as needed for pain. Continue guaifenesin (as expectorant to keep mucus thin). Continue decongestants as needed for head congestion and postnasal drainage. Consider changing to a 12 hour formulation of Mucinex (plain, along with Delsym at bedtime, vs the DM version twice daily, along with decongestant during the day, to replace your current short-acting medication). If your cough is getting worse, you can see if you have tessalon left at home (or call for refill-this is not sedating) vs calling for a narcotic syrup to take just at bedtime (vs trying your leftover pain pills at bedtime).

## 2014-04-02 NOTE — Progress Notes (Signed)
Chief Complaint  Patient presents with  . Sore Throat    that started a week ago. Dry cough. This past Thurs and Fri started feeling better. Saturday night throat began to really hurt, wonders if she has strep throat.    8 days ago she started with dry throat, dry cough (like she breathed in smoke).  She never had sinus congestion, only had symptomsin her throat.  4-5 days ago it started feeling better, but then the last 2 nights she has felt worse--bad cough, ongoing sore throat.  She has developed some sinus congestion over the last couple of days.  This morning she got up some thick mucus, somewhat discolored/thick just in the morning.  Nasal mucus during the day has been clear/white.  Cough is now productive (thick, white, discolored just this morning).  No known fever/chills.  She feels like there is fluid in the left ear.    She has been using Mucinex Day and Night; she reports it also contains acetaminophen.  It seems to be helping, but wears off in the middle of the night. Sick contacts--son was run down and had headache, but no other symptoms.  She has been doing well since recent hysterectomy, without any night sweats or hot flashes.  Overall feeling better, energy improved since not having excessive bleeding.  PMH, PSH, SH reviewed. Medications--hasn't taken much of anything over the last week other than the Mucinex, and some zyrtec. Allergies  Allergen Reactions  . Lactose Intolerance (Gi)     GI upset    ROS:  No fevers, chills.  No headaches since last week.  no chest pain, palpitations, shortness of breath. Denies nausea, vomiting, diarrhea, bleeding, bruising, rash, joint pains, body aches (slight achiness a week ago). See HPI.  PHYSICAL EXAM: BP 130/86 mmHg  Pulse 68  Temp(Src) 98.2 F (36.8 C) (Tympanic)  Ht 5\' 5"  (1.651 m)  Wt 212 lb (96.163 kg)  BMI 35.28 kg/m2  Well developed, pleasant female in no distress.  Intermittent deep cough. HEENT:  PERRL, EOMI,  conjunctiva clear.  TM's and EAC's normal.  Nasal mucosa is moderately edematous, pale with white mucus.  OP has some erythema posteriorly and in tonsillar fossa (tonsils removed).  No exudates Neck: mild lymphadenopathy, nontender Heart: regular rate and rhythm without murmur Lungs: clear bilaterally Skin: no rash Neuro: alert and oriented.  Cranial nerves intact. Normal gait, strength Psych: normal mood, affect, hygiene and grooming  Negative rapid strep test  ASSESSMENT/PLAN:  Acute upper respiratory infection  Sore throat - Plan: Rapid Strep A  Cough   Drink plenty of fluids. Use salt water gargles and/or chloraseptic spray, along with acetaminophen or ibuprofen as needed for pain. Continue guaifenesin (as expectorant to keep mucus thin). Continue decongestants as needed for head congestion and postnasal drainage. Consider changing to a 12 hour formulation of Mucinex (plain, along with Delsym at bedtime, vs the DM version twice daily, along with decongestant during the day, to replace your current short-acting medication). If your cough is getting worse, you can see if you have tessalon left at home (or call for refill-this is not sedating) vs calling for a narcotic syrup to take just at bedtime (vs trying your leftover pain pills at bedtime).

## 2014-04-04 ENCOUNTER — Encounter: Payer: Self-pay | Admitting: Family Medicine

## 2014-04-04 ENCOUNTER — Ambulatory Visit
Admission: RE | Admit: 2014-04-04 | Discharge: 2014-04-04 | Disposition: A | Discharge status: Home / Self Care | Payer: 59 | Source: Ambulatory Visit

## 2014-04-04 DIAGNOSIS — Z1231 Encounter for screening mammogram for malignant neoplasm of breast: Secondary | ICD-10-CM

## 2014-04-04 DIAGNOSIS — B001 Herpesviral vesicular dermatitis: Secondary | ICD-10-CM

## 2014-04-04 MED ORDER — VALACYCLOVIR HCL 1 G PO TABS
ORAL_TABLET | ORAL | Status: DC
Start: 2014-04-04 — End: 2015-04-15

## 2014-04-06 ENCOUNTER — Other Ambulatory Visit: Payer: Self-pay | Admitting: Obstetrics and Gynecology

## 2014-04-06 DIAGNOSIS — R928 Other abnormal and inconclusive findings on diagnostic imaging of breast: Secondary | ICD-10-CM

## 2014-04-09 ENCOUNTER — Other Ambulatory Visit: Payer: Self-pay | Admitting: Obstetrics and Gynecology

## 2014-04-09 ENCOUNTER — Other Ambulatory Visit: Payer: Self-pay | Admitting: *Deleted

## 2014-04-09 ENCOUNTER — Encounter: Payer: Self-pay | Admitting: Family Medicine

## 2014-04-09 DIAGNOSIS — R928 Other abnormal and inconclusive findings on diagnostic imaging of breast: Secondary | ICD-10-CM

## 2014-04-09 MED ORDER — BENZONATATE 200 MG PO CAPS: 200.0000 mg | ORAL_CAPSULE | Freq: Three times a day (TID) | ORAL | Status: DC | PRN

## 2014-04-11 ENCOUNTER — Telehealth: Payer: Self-pay | Admitting: Obstetrics and Gynecology

## 2014-04-11 NOTE — Telephone Encounter (Signed)
Pt is calling to talk with Janett Billow about some paperwork that was suppose to be faxed for her.

## 2014-04-16 ENCOUNTER — Telehealth: Payer: Self-pay | Admitting: Obstetrics and Gynecology

## 2014-04-16 NOTE — Telephone Encounter (Signed)
Patient calling requesting guidance on medical leave. She has a six week post op appointment 04/23/14 and needs to know if she has to wait until then to return to work. Her FMLA forms have her returning to work tomorrow, 04/17/14. She will need a doctor's note to extend her leave if so.

## 2014-04-16 NOTE — Telephone Encounter (Signed)
Detailed message left on mobile okay per designated party release form to advise:  Patient okay to return to work as scheduled for 04/17/14 unless she is having a problem, in this case, will need evaluation by Dr. Quincy Simmonds.  If not issues, can return to work as scheduled for 12/22 and have post op 04/23/14 and if needs note for excuse for work can obtain at this time.

## 2014-04-17 NOTE — Telephone Encounter (Signed)
Detailed message left.  Routing to provider for final review. Patient agreeable to disposition. Will close encounter

## 2014-04-23 ENCOUNTER — Ambulatory Visit (INDEPENDENT_AMBULATORY_CARE_PROVIDER_SITE_OTHER): Payer: 59 | Admitting: Obstetrics and Gynecology

## 2014-04-23 ENCOUNTER — Encounter: Payer: Self-pay | Admitting: Obstetrics and Gynecology

## 2014-04-23 VITALS — BP 124/66 | HR 88 | Ht 65.5 in | Wt 214.4 lb

## 2014-04-23 DIAGNOSIS — Z9889 Other specified postprocedural states: Secondary | ICD-10-CM

## 2014-04-23 NOTE — Progress Notes (Signed)
Patient ID: Stacy Boyer, female   DOB: 07-15-1965, 48 y.o.   MRN: [id number] GYNECOLOGY  VISIT   HPI: 48 y.o.   Married  Caucasian  female   [phone] with Patient's last menstrual period was 11/25/2013.   here for 6 weeks post op visit.    No vaginal bleeding or drainage from her incisions.   Some bladder leakage with sneezing.   Some brain fog and hot flashes.  Feels like she is doing OK without hormones.   Has a diagnostic mammogram and ultrasound of the right breast tomorrow due to an abnormal screening mammogram.   Family history of breast cancer.   GYNECOLOGIC HISTORY: Patient's last menstrual period was 11/25/2013. Contraception:  Vasectomy/R-TLH/BSO  Menopausal hormone therapy: none        OB History    Gravida Para Term Preterm AB TAB SAB Ectopic Multiple Living   3 2   1  1   2          Patient Active Problem List   Diagnosis Date Noted  . Status post laparoscopic hysterectomy 03/05/2014  . Endometrial hyperplasia without atypia, complex 02/10/2014  . Obesity (BMI 30.0-34.9) 06/01/2012  . Irregular menses 06/01/2012  . Elevated LFTs 05/04/2011  . Mixed hyperlipidemia 05/04/2011  . 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 Allergic rhinitis, cause unspecified 04/13/2011    Past Medical History  Diagnosis Date  . Allergy     seasonal and dust  . Herpes simplex labialis   . Mixed hyperlipidemia     diet controlled  . Other abnormal blood chemistry   . SVD (spontaneous vaginal delivery)     x 1  . Arthritis     hands  . Anemia     Hx    Past Surgical History  Procedure Laterality Date  . Tonsillectomy  age 68  . Refractive surgery    . Cesarean section      x 1  . Dilation and curettage of uterus  1995    miscarrige  . Dilation and curettage of uterus  2015  . Eye surgery      Lasik eye surgery  . Wisdom tooth extraction    . Robotic assisted total hysterectomy with bilateral salpingo oopherectomy N/A 03/05/2014    Procedure: ROBOTIC ASSISTED  TOTAL HYSTERECTOMY WITH BILATERAL SALPINGO OOPHORECTOMY WITH PELVIC WASHINGS;  Surgeon: Jamey Reas de Berton Lan, MD;  Location: [facility];  Service: Gynecology;  Laterality: N/A;  . Cystoscopy N/A 03/05/2014    Procedure: CYSTOSCOPY;  Surgeon: Jamey Reas de Berton Lan, MD;  Location: [facility];  Service: Gynecology;  Laterality: N/A;    Current Outpatient Prescriptions  Medication Sig Dispense Refill  . b complex vitamins tablet Take 1 tablet by mouth daily.    . calcium carbonate (TUMS - DOSED IN MG ELEMENTAL CALCIUM) 500 MG chewable tablet Chew 1 tablet by mouth as needed for heartburn (for Calcium).    . cetirizine (ZYRTEC) 10 MG tablet Take 10 mg by mouth daily.    Marland Kitchen ketotifen (ZADITOR) 0.025 % ophthalmic solution Place 1 drop into both eyes 2 (two) times daily.    . naproxen sodium (ANAPROX) 550 MG tablet Take 1 tablet (550 mg total) by mouth 2 (two) times daily with a meal. Takes as needed for pain. 30 tablet 0  . Omega-3 Fatty Acids (FISH OIL) 500 MG CAPS Take 1 capsule by mouth daily. MegaRed    . valACYclovir (VALTREX) 1000 MG tablet Take 2 tablets at  onset of cold sore.  Repeat dose of additional 2 tablets in 12 hours (4 tablets per episode) 30 tablet 1   No current facility-administered medications for this visit.     ALLERGIES: Lactose intolerance (gi)  Family History  Problem Relation Age of Onset  . Hyperlipidemia Mother   . Diabetes Mother   . Melanoma Mother   . Arthritis Mother   . Arthritis Father     rheumatoid  . Diabetes Father   . Heart disease Father 55    MI, s/p CABG  . Hypertension Father   . Hyperlipidemia Father   . Atrial fibrillation Father   . Diabetes Brother     resolved after gastric bypass/weight loss   . Hypertension Brother     off meds s/p weight loss  . Cancer Paternal Grandmother     breast cancer in 40's  . Heart disease Maternal Grandmother     50 or younger?  . Stroke Maternal Grandmother     34's  . Cancer  Paternal Aunt     breast cancer in 2 paternal aunts  . Cancer Maternal Aunt     lung cancer (smoker)    History   Social History  . Marital Status: Married    Spouse Name: N/A    Number of Children: 2  . Years of Education: N/A   Occupational History  . Program Chair at [employer]    Social History Main Topics  . Smoking status: Never Smoker   . Smokeless tobacco: Never Used  . Alcohol Use: 0.6 oz/week    1 Not specified per week     Comment: occasional  . Drug Use: No  . Sexual Activity:    Partners: Male    Birth Control/ Protection: None     Comment: husband had vasectomy/R-TLH/BSO   Other Topics Concern  . Not on file   Social History Narrative   Lives with 2 sons, husband and 3 dogs    ROS:  Pertinent items are noted in HPI.  PHYSICAL EXAMINATION:    BP 124/66 mmHg  Pulse 88  Ht 5' 5.5" (1.664 m)  Wt 214 lb 6.4 oz (97.251 kg)  BMI 35.12 kg/m2  LMP 11/25/2013     General appearance: alert, cooperative and appears stated age   Abdomen: incisions intact, soft, non-tender; no masses,  no organomegaly   Pelvic: External genitalia:  no lesions              Urethra:  normal appearing urethra with no masses, tenderness or lesions              Bartholins and Skenes: normal                 Vagina: normal appearing vagina with normal color and discharge, no lesions.  Cuff intact.  No suture visible, but suture line is palpable.               Cervix:  Absent.                   Bimanual Exam:  Uterus:   Absent.                                      Adnexa:  Absent.  No massed noted.  ASSESSMENT  Status post robotic total laparoscopic hysterectomy with bilateral salpingo-oophorectomy for complex endometrial hyperplasia.  Mild menopausal symptoms.  Abnormal screening mammogram.   PLAN  Return to sexual activity and exercise at 8 weeks post op.  Complete diagnostic mammogram and ultrasound tomorrow.  No ERT at this time.   Patient will contact me back if she would like to consider this.  Follow up with me prn.  Patient will continue her care with her PCP.    An After Visit Summary was printed and given to the patient.  __20____ minutes face to face time of which over 50% was spent in counseling.

## 2014-04-24 ENCOUNTER — Ambulatory Visit
Admission: RE | Admit: 2014-04-24 | Discharge: 2014-04-24 | Disposition: A | Discharge status: Home / Self Care | Payer: 59 | Source: Ambulatory Visit | Attending: Obstetrics and Gynecology | Admitting: Obstetrics and Gynecology

## 2014-04-24 ENCOUNTER — Encounter: Payer: Self-pay | Admitting: Obstetrics and Gynecology

## 2014-04-24 DIAGNOSIS — R928 Other abnormal and inconclusive findings on diagnostic imaging of breast: Secondary | ICD-10-CM

## 2014-04-25 ENCOUNTER — Encounter: Payer: Self-pay | Admitting: Obstetrics and Gynecology

## 2014-04-25 ENCOUNTER — Telehealth: Payer: Self-pay | Admitting: Obstetrics and Gynecology

## 2014-04-25 NOTE — Telephone Encounter (Signed)
Spoke with patient. Patient states she just needs a letter stating she is able to return to work with date, signature from [place], and letter head. I have opened a letter in [employer] to create for patient but could not find date she should return to work in notes. Please review and advise. Will be happy to print for your signature and fax to patient afterwards.

## 2014-04-25 NOTE — Telephone Encounter (Signed)
Letter can say return to work on 04/17/14, which is what her [employer] forms were filled out as. Patient had an appointment on 04/23/14 for her final post op visit with me.  She would need to be excused from any absence due her appointment on that date as well.  Thanks!

## 2014-04-25 NOTE — Telephone Encounter (Signed)
I will be happy to sign the letter! Please send it electronically or in paper form for me to sign.  We will not able able to send the letter to patient through regular email of hers.

## 2014-04-25 NOTE — Telephone Encounter (Signed)
Routing to [place] for letter review and advise. I will be happy to send letter for patient.

## 2014-04-25 NOTE — Telephone Encounter (Signed)
Pt was in to see Dr Quincy Simmonds for postop on Monday and says she need a form or note to return to work.

## 2014-04-26 NOTE — Telephone Encounter (Signed)
I signed it with you now.

## 2014-04-26 NOTE — Telephone Encounter (Signed)
Letter printed and to your desk for review and signature before faxing.

## 2014-04-26 NOTE — Telephone Encounter (Signed)
Spoke with patient. Patient requests letter be faxed to work at [phone]. Patient states she will be by fax machine ready for pick up. Faxed letter with cover sheet and fax confirmation.   Routing to provider for final review. Patient agreeable to disposition. Will close encounter

## 2014-08-16 ENCOUNTER — Ambulatory Visit (INDEPENDENT_AMBULATORY_CARE_PROVIDER_SITE_OTHER): Payer: 59 | Admitting: Family Medicine

## 2014-08-16 ENCOUNTER — Encounter: Payer: Self-pay | Admitting: Family Medicine

## 2014-08-16 VITALS — BP 140/86 | HR 80 | Ht 65.0 in | Wt 215.2 lb

## 2014-08-16 DIAGNOSIS — M25512 Pain in left shoulder: Secondary | ICD-10-CM | POA: Diagnosis not present

## 2014-08-16 DIAGNOSIS — M7542 Impingement syndrome of left shoulder: Secondary | ICD-10-CM | POA: Diagnosis not present

## 2014-08-16 MED ORDER — TRIAMCINOLONE ACETONIDE 40 MG/ML IJ SUSP
30.0000 mg | Freq: Once | INTRAMUSCULAR | Status: AC
  Administered 2014-08-16: 30 mg via INTRAMUSCULAR

## 2014-08-16 MED ORDER — BUPIVACAINE HCL 0.5 % IJ SOLN
3.0000 mL | Freq: Once | INTRAMUSCULAR | Status: AC
  Administered 2014-08-16: 3 mL

## 2014-08-16 MED ORDER — LIDOCAINE HCL (PF) 2 % IJ SOLN
3.0000 mL | Freq: Once | INTRAMUSCULAR | Status: AC
  Administered 2014-08-16: 3 mL

## 2014-08-16 NOTE — Patient Instructions (Signed)
If your shoulder pain isn't improving, go to [place] (either [address]) to get x-ray. Expect it to take 1-2 weeks for the full effect from the injection. Consider physical therapy again if not improving.  Joint Injection Care After Refer to this sheet in the next few days. These instructions provide you with information on caring for yourself after you have had a joint injection. Your caregiver also may give you more specific instructions. Your treatment has been planned according to current medical practices, but problems sometimes occur. Call your caregiver if you have any problems or questions after your procedure. After any type of joint injection, it is not uncommon to experience:  Soreness, swelling, or bruising around the injection site.  Mild numbness, tingling, or weakness around the injection site caused by the numbing medicine used before or with the injection. It also is possible to experience the following effects associated with the specific agent after injection:  Iodine-based contrast agents:  Allergic reaction (itching, hives, widespread redness, and swelling beyond the injection site).  Corticosteroids (These effects are rare.):  Allergic reaction.  Increased blood sugar levels (If you have diabetes and you notice that your blood sugar levels have increased, notify your caregiver).  Increased blood pressure levels.  Mood swings.  Hyaluronic acid in the use of viscosupplementation.  Temporary heat or redness.  Temporary rash and itching.  Increased fluid accumulation in the injected joint. These effects all should resolve within a day after your procedure.  HOME CARE INSTRUCTIONS  Limit yourself to light activity the day of your procedure. Avoid lifting heavy objects, bending, stooping, or twisting.  Take prescription or over-the-counter pain medication as directed by your caregiver.  You may apply ice to your injection site to reduce  pain and swelling the day of your procedure. Ice may be applied 03-04 times:  Put ice in a plastic bag.  Place a towel between your skin and the bag.  Leave the ice on for no longer than 15-20 minutes each time. SEEK IMMEDIATE MEDICAL CARE IF:   Pain and swelling get worse rather than better or extend beyond the injection site.  Numbness does not go away.  Blood or fluid continues to leak from the injection site.  You have chest pain.  You have swelling of your face or tongue.  You have trouble breathing or you become dizzy.  You develop a fever, chills, or severe tenderness at the injection site that last longer than 1 day. MAKE SURE YOU:  Understand these instructions.  Watch your condition.  Get help right away if you are not doing well or if you get worse. Document Released: 12/25/2010 Document Revised: 07/06/2011 Document Reviewed: 12/25/2010 [facility] Patient Information 2015 [place], [place]. This information is not intended to replace advice given to you by your health care provider. Make sure you discuss any questions you have with your health care provider.

## 2014-08-16 NOTE — Progress Notes (Signed)
Chief Complaint  Patient presents with  . Shoulder Pain    left shoulder pain over a month. Reaching up does not hurt-when she puts the arm back down it whe a shooting pain occurs. Also when getting dressed. Over the last month she has been taking aleve 440 BID without any relief-this was recommended when her right shoulder was hurting her-notes that this feels different.    She has been having left shoulder pain for at least a month.  Denies any known trauma, injury, or change in activity.  Has gotten worse over the last month.  It hurts even just to pull up her underwear (elevating her arm to pull up the front part as well as reaching back).  Sudden movements like to catch something she drops, or reaching up also hurts--hurts when she releases her arm to put it back down, and pain shoots down the left arm. She hasn't been able to use her left arm to put her bra on. Sometimes feels popping, rubbing.  Denies numbness/tingling into arm/hands.  Denies weakness, not dropping things.  No neck pain.  Previously had problems with her right shoulder (about 3 years ago)--treated with NSAIDs and dry needling/PT with Aart Schulenklopper.  PMH, PSH, SH reviewed.  Outpatient Encounter Prescriptions as of 08/16/2014  Medication Sig Note  . b complex vitamins tablet Take 1 tablet by mouth daily.   . naproxen sodium (ANAPROX) 220 MG tablet Take 440 mg by mouth 2 (two) times daily with a meal. 08/16/2014: Often takes 2 in the morning, 1 at bedtime; usually takes just 1/day for hand arthritis; using more for shoulder pain  . Omega-3 Fatty Acids (FISH OIL) 500 MG CAPS Take 1 capsule by mouth daily. MegaRed   . calcium carbonate (TUMS - DOSED IN MG ELEMENTAL CALCIUM) 500 MG chewable tablet Chew 1 tablet by mouth as needed for heartburn (for Calcium).   . cetirizine (ZYRTEC) 10 MG tablet Take 10 mg by mouth daily.   Marland Kitchen ketotifen (ZADITOR) 0.025 % ophthalmic solution Place 1 drop into both eyes 2 (two) times daily.   .  valACYclovir (VALTREX) 1000 MG tablet Take 2 tablets at onset of cold sore.  Repeat dose of additional 2 tablets in 12 hours (4 tablets per episode) (Patient not taking: Reported on 08/16/2014)   . [DISCONTINUED] naproxen sodium (ANAPROX) 550 MG tablet Take 1 tablet (550 mg total) by mouth 2 (two) times daily with a meal. Takes as needed for pain.    Allergies  Allergen Reactions  . Lactose Intolerance (Gi)     GI upset   ROS: no fevers, chills, URI symptoms, chest pain, shortness of breath, bleeding, bruising, numbness, tingling, weakness.  Some hand pain, no other joint pains except for left shoulder.  Right shoulder pain resolved.  No edema, or other concerns except as per HPI  PHYSICAL EXAM: BP 140/86 mmHg  Pulse 80  Ht 5\' 5"  (1.651 m)  Wt 215 lb 3.2 oz (97.614 kg)  BMI 35.81 kg/m2  LMP 11/25/2013 Pleasant female, in obvious discomfort and limited ROM with evaluation of left shoulder. Painful arc with abduction, doesn't have FROM--lacks last 10-15 degrees. Painful arc also with forward flexion--45 degrees shy of 180 with active ROM, 15 degrees shy with passive ROM. Decreased ROM reaching across chest. Pain with internal rotation--unable to reach back as if fastening bra--can't get thumb above level of buttocks. Pain with supraspinatous testing. Unable to test subscapularis given limited range of motion and pain. +impingement Strength is normal. Normal  sensation nontender at [facility] joint  ASSESSMENT/PLAN:  Left shoulder pain - Plan: DG Shoulder Left, bupivacaine (MARCAINE) 0.5 % (with pres) injection 3 mL, lidocaine (XYLOCAINE) 2 % injection 3 mL, triamcinolone acetonide (KENALOG-40) injection 30 mg  Shoulder impingement, left - Plan: PR DRAIN/INJECT LARGE JOINT/BURSA  Risks/side effects reviewed, as well as anticipated course.  Left shoulder bursa was injected with 30mg  kenalog, and 3cc each of 2% lidocaine and 0.5% marcaine.  She tolerated procedure well with slight improvement in  pain and ROM immediately.  X-ray L shoulder if not improving--order entered  Consider PT if ongoing pain. She was shown ROM exercises to do daily. Risks of frozen shoulder reviewed--she may already have started to have some

## 2014-08-22 ENCOUNTER — Ambulatory Visit
Admission: RE | Admit: 2014-08-22 | Discharge: 2014-08-22 | Disposition: A | Discharge status: Home / Self Care | Payer: PRIVATE HEALTH INSURANCE | Source: Ambulatory Visit | Attending: Family Medicine | Admitting: Family Medicine

## 2014-08-22 DIAGNOSIS — M25512 Pain in left shoulder: Secondary | ICD-10-CM

## 2015-04-15 ENCOUNTER — Encounter: Payer: Self-pay | Admitting: Family Medicine

## 2015-04-15 ENCOUNTER — Ambulatory Visit (INDEPENDENT_AMBULATORY_CARE_PROVIDER_SITE_OTHER): Payer: 59 | Admitting: Family Medicine

## 2015-04-15 VITALS — BP 128/80 | HR 78 | Resp 14 | Wt 223.6 lb

## 2015-04-15 DIAGNOSIS — B001 Herpesviral vesicular dermatitis: Secondary | ICD-10-CM | POA: Diagnosis not present

## 2015-04-15 DIAGNOSIS — J0100 Acute maxillary sinusitis, unspecified: Secondary | ICD-10-CM | POA: Diagnosis not present

## 2015-04-15 MED ORDER — BENZONATATE 100 MG PO CAPS: 200.0000 mg | ORAL_CAPSULE | Freq: Three times a day (TID) | ORAL | Status: DC | PRN

## 2015-04-15 MED ORDER — VALACYCLOVIR HCL 1 G PO TABS: ORAL_TABLET | ORAL | Status: DC

## 2015-04-15 MED ORDER — AZITHROMYCIN 500 MG PO TABS: 500.0000 mg | ORAL_TABLET | Freq: Every day | ORAL | Status: DC

## 2015-04-15 NOTE — Patient Instructions (Signed)
You should be back to normal in 7-10 days and if you're not call for refill

## 2015-04-15 NOTE — Progress Notes (Signed)
   Subjective:    Patient ID: Stacy Boyer, female    DOB: [birth year], 49 y.o.   MRN: [id number]  HPI She complains of a one-week history this started with PND followed by a dry cough that is slowly been getting worse, sinus congestion and pain silight sore throat. No he, fever, chills. She does not smoke.She also has a history of herpes labialis and would like a refill on her Valtrex.   Review of Systems     Objective:   Physical Exam Alert and in no distress. Tympanic membranes and canals are normal. Pharyngeal area is normal. Neck is supple without adenopathy or thyromegaly. Cardiac exam shows a regular sinus rhythm without murmurs or gallops. Lungs are clear to auscultation.Lips show no evidence of herpetic lesions.        Assessment & Plan:  Herpes simplex labialis - Plan: valACYclovir (VALTREX) 1000 MG tablet  Acute maxillary sinusitis, recurrence not specified - Plan: benzonatate (TESSALON PERLES) 100 MG capsule, azithromycin (ZITHROMAX) 500 MG tablet he is to call if not entiretter in 7-10 days. She did get a refill on her Tessalon as this has worked well for the cough in the past.

## 2015-08-26 ENCOUNTER — Ambulatory Visit (INDEPENDENT_AMBULATORY_CARE_PROVIDER_SITE_OTHER): Payer: BLUE CROSS/BLUE SHIELD | Admitting: Medical

## 2015-08-26 ENCOUNTER — Encounter: Payer: Self-pay | Admitting: Medical

## 2015-08-26 VITALS — BP 126/88 | HR 97 | Temp 98.2°F | Resp 18 | Wt 216.0 lb

## 2015-08-26 DIAGNOSIS — R300 Dysuria: Secondary | ICD-10-CM | POA: Diagnosis not present

## 2015-08-26 DIAGNOSIS — R059 Cough, unspecified: Secondary | ICD-10-CM

## 2015-08-26 DIAGNOSIS — R3129 Other microscopic hematuria: Secondary | ICD-10-CM | POA: Diagnosis not present

## 2015-08-26 DIAGNOSIS — R05 Cough: Secondary | ICD-10-CM

## 2015-08-26 DIAGNOSIS — J0110 Acute frontal sinusitis, unspecified: Secondary | ICD-10-CM

## 2015-08-26 LAB — POCT URINALYSIS DIPSTICK
Bilirubin, UA: NEGATIVE
GLUCOSE UA: NEGATIVE
Ketones, UA: NEGATIVE
Leukocytes, UA: NEGATIVE
Nitrite, UA: NEGATIVE
Protein, UA: NEGATIVE
SPEC GRAV UA: 1.01
UROBILINOGEN UA: NEGATIVE
pH, UA: 6

## 2015-08-26 MED ORDER — AMOXICILLIN 875 MG PO TABS: 875.0000 mg | ORAL_TABLET | Freq: Two times a day (BID) | ORAL | Status: DC

## 2015-08-26 MED ORDER — BENZONATATE 200 MG PO CAPS: 200.0000 mg | ORAL_CAPSULE | Freq: Three times a day (TID) | ORAL | Status: DC | PRN

## 2015-08-26 NOTE — Progress Notes (Signed)
Subjective: Chief Complaint  Patient presents with  . cough    said her lungs felt " sandy" started wednesday. said her throat burns. her sinuses are hurting and she has a headache. said it has gotten worse every day. could barely get out of bed yesterday. body aches   Here for not feeling well.  Started coughing about 5 days ago, has gotten progressively worse, now sinuses are burning, congested, throat is burning, still coughing and sneezing.    Also having some burning with urination.    No fever, has felt warm, but no chills, no sweats, no NVD.  Hasn't had UTI in years.  But is having burning with urination, but no urgency or frequency.  Taking some mucinex, ibuprofen to help with headache and not feeling well.  Son was sick about 1.5 weeks ago with respiratory symptoms.   She does have hx/o sinusitis. No other aggravating or relieving factors. No other complaint.  Past Medical History  Diagnosis Date  . Allergy     seasonal and dust  . Herpes simplex labialis   . Mixed hyperlipidemia     diet controlled  . Other abnormal blood chemistry   . SVD (spontaneous vaginal delivery)     x 1  . Arthritis     hands  . Anemia     Hx   ROS as in subjective   Objective: BP 126/88 mmHg  Pulse 97  Temp(Src) 98.2 F (36.8 C) (Tympanic)  Resp 18  Wt 216 lb (97.977 kg)  LMP 11/25/2013  General appearance: Alert, WD/WN, no distress                             Skin: warm, no rash                           Head: +frontal sinus tenderness,                            Eyes: conjunctiva normal, corneas clear, PERRLA                            Ears: serous effusions bilat TMs, external ear canals normal                          Nose: septum midline, turbinates swollen, with erythema and clear discharge             Mouth/throat: MMM, tongue normal, mild pharyngeal erythema                           Neck: supple, no adenopathy, no thyromegaly, non tender                         Lungs: +rhonchi,  bronchial sounding otherwise no wheezes, rales, or rhonchi        Assessment: Encounter Diagnoses  Name Primary?  . Cough Yes  . Acute frontal sinusitis, recurrence not specified   . Dysuria   . Microscopic hematuria       Plan: Begin Amxocillin, Tessalon perles, hydrate well, can c/t Mucinex, rest.   If worse or not much improved by the end of the week, let me know.   Return in 2 wk for  clean catch UA to ensure hematuria has resolved.

## 2015-10-09 IMAGING — MG MM SCREENING BREAST TOMO BILATERAL
8 series · 8 of 24 positions shown · non-contrast
Comparison: Previous exam(s)

ACR Breast Density Category a: The breast tissue is almost entirely
fatty.

CLINICAL DATA: Screening.

EXAM:
DIGITAL SCREENING BILATERAL MAMMOGRAM WITH 3D TOMO WITH CAD

[R MLO]
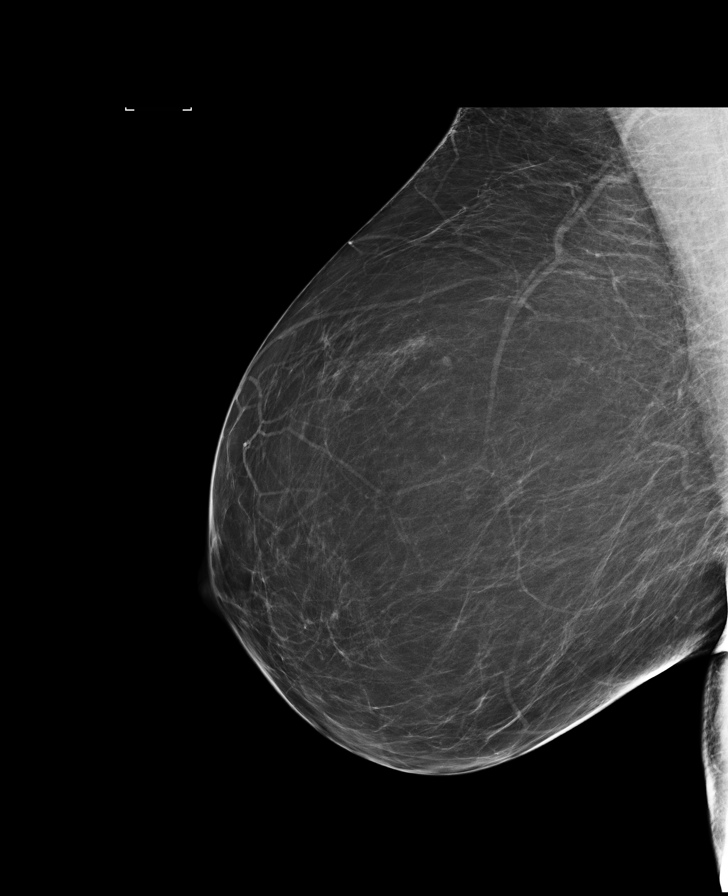

[L CC]
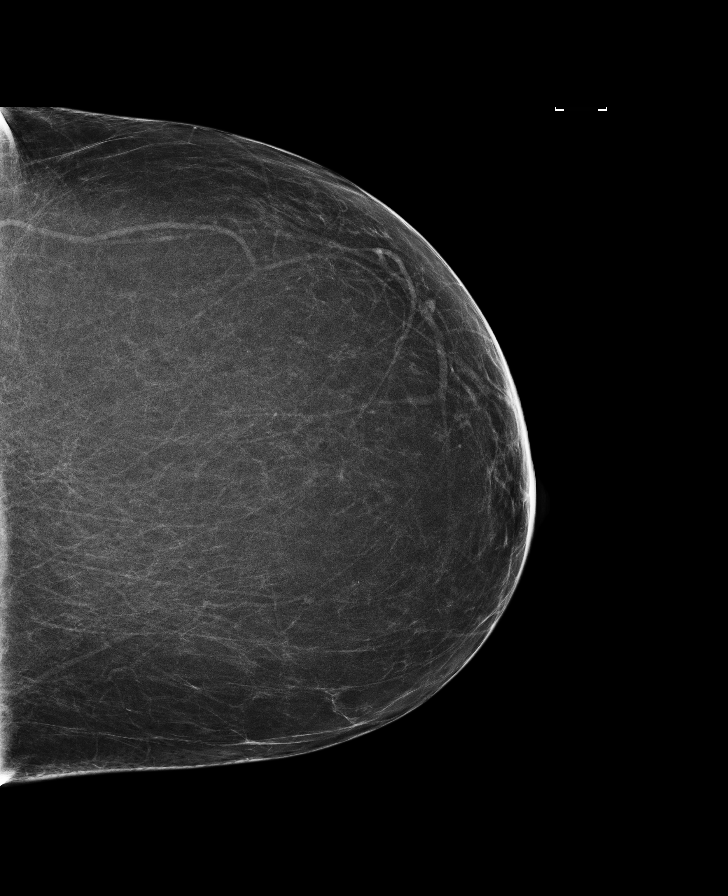

[L MLO]
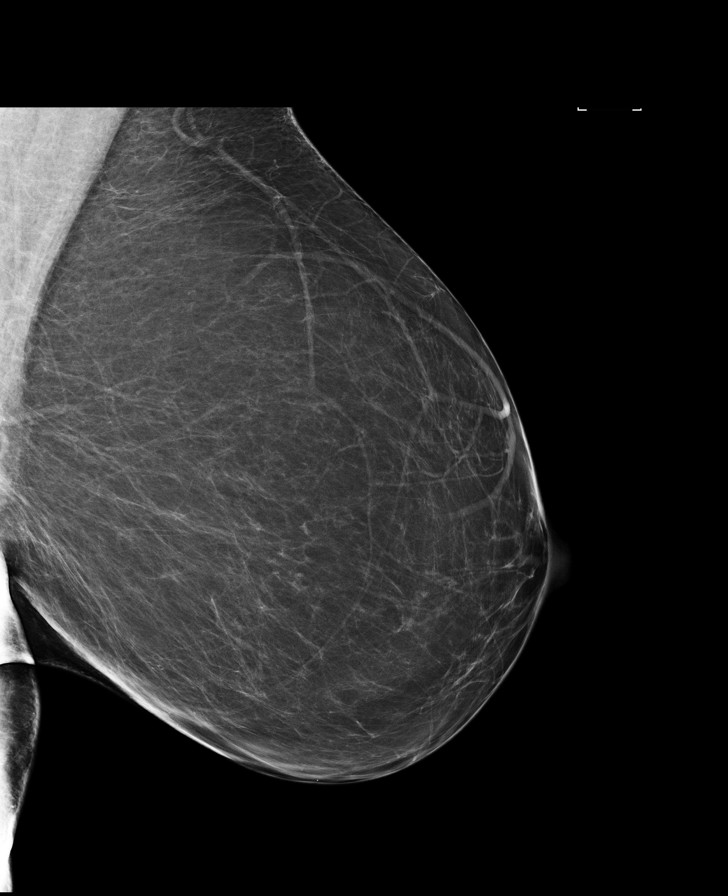

[R CC]
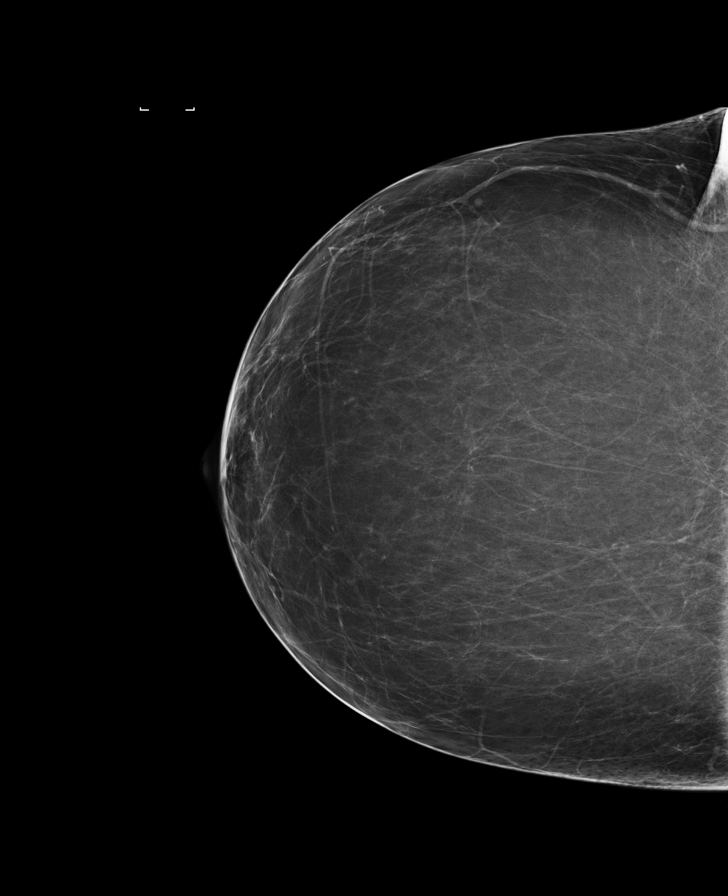

[R MLO tomo · tomo slice 49/96.0]
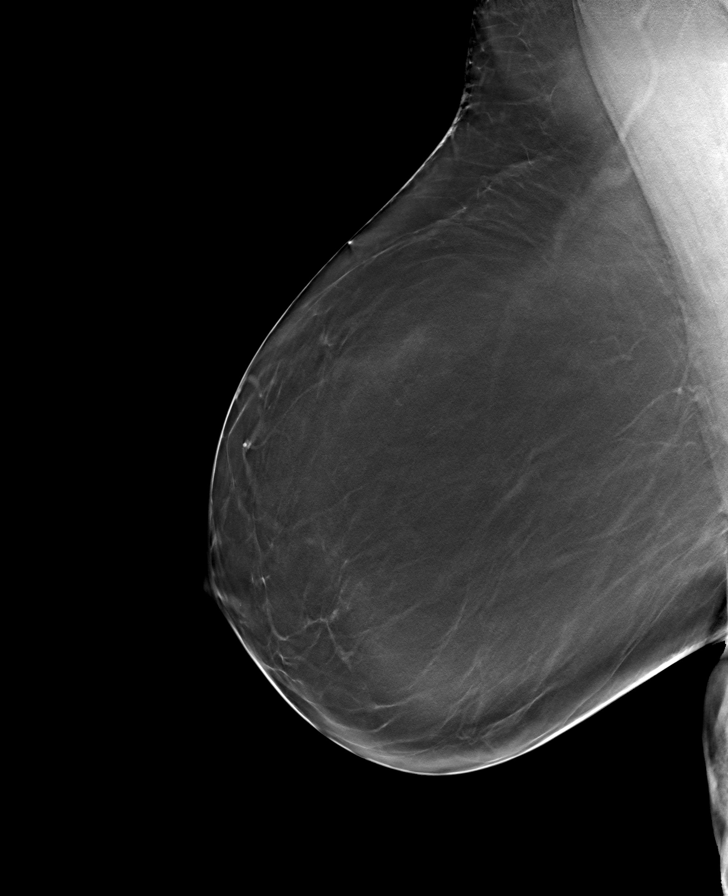

[L MLO tomo · tomo slice 48/95.0]
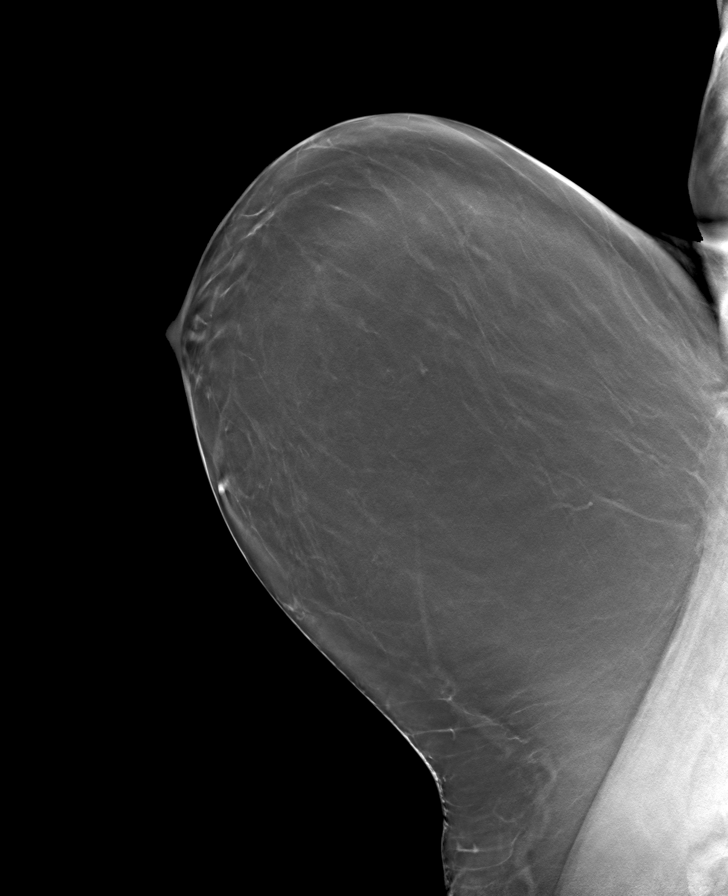

[R CC tomo · tomo slice 41/80.0]
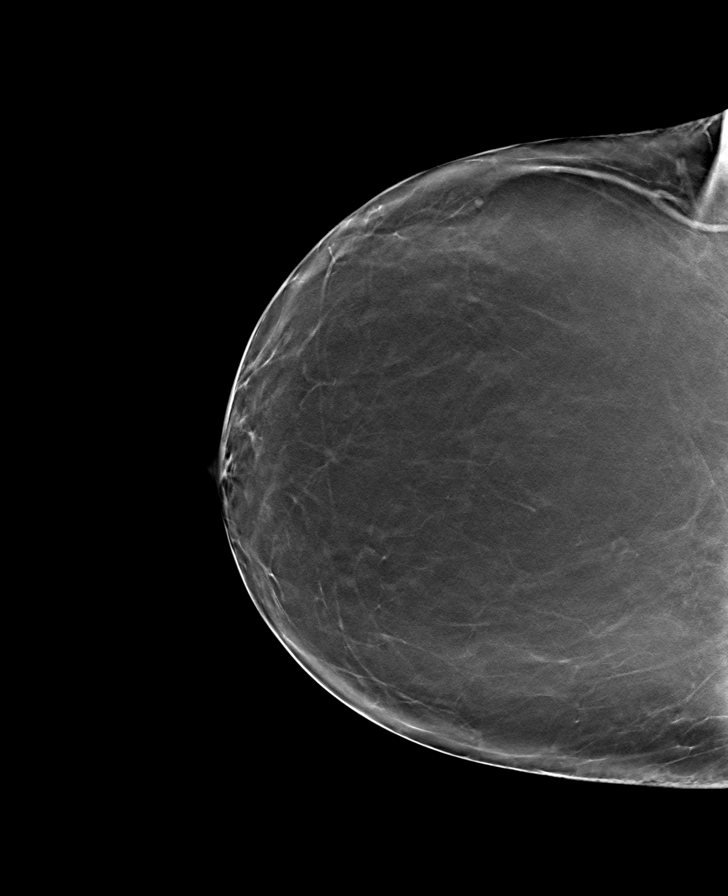

[L CC tomo · tomo slice 42/83.0]
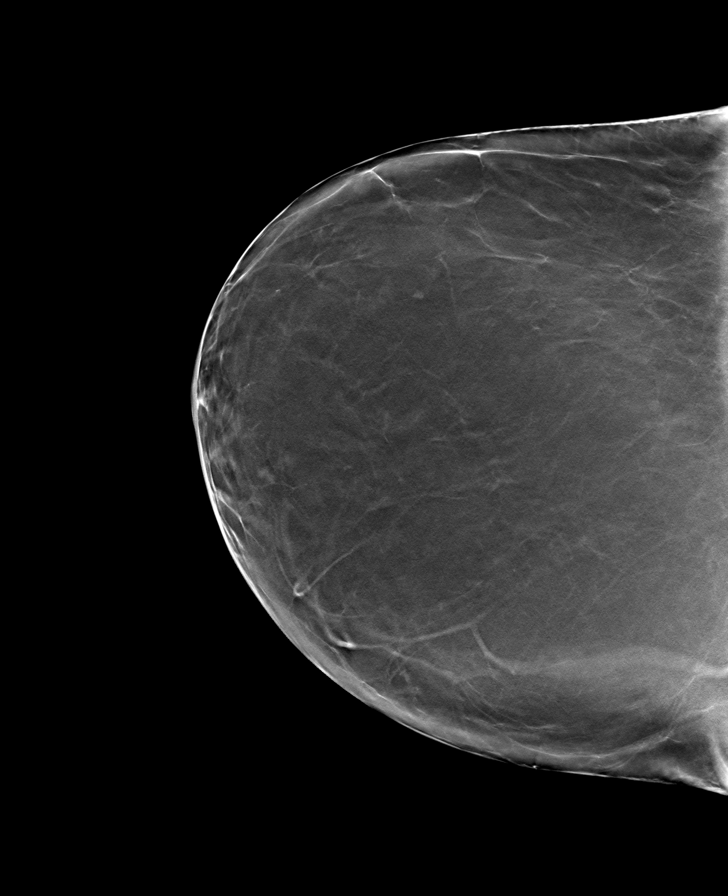

[8 of 24 positions shown; findings below may reference images not displayed]

FINDINGS: In the right breast, a possible mass warrants further evaluation
with spot compression views and possibly ultrasound. In the left
breast, no findings suspicious for malignancy. Images were processed
with CAD.
IMPRESSION: Further evaluation is suggested for possible mass in the right
breast.

RECOMMENDATION:
Diagnostic mammogram and possibly ultrasound of the right breast.
(Code:XK-J-ZZA)

The patient will be contacted regarding the findings, and additional
imaging will be scheduled.

BI-RADS CATEGORY  0: Incomplete. Need additional imaging evaluation
and/or prior mammograms for comparison.

## 2015-10-29 IMAGING — US US BREAST*R* LIMITED INC AXILLA
1 series · 6 of 6 positions shown · non-contrast
Comparison: Prior exams

ACR Breast Density Category a: The breast tissue is almost entirely
fatty.

CLINICAL DATA: Call back from screening for a possible right breast
mass.

EXAM:
DIGITAL DIAGNOSTIC  RIGHT MAMMOGRAM
ULTRASOUND RIGHT BREAST

[Series 1: us breast*right* limited inc axilla · 6 of 6 slices shown]
[im 1/6]
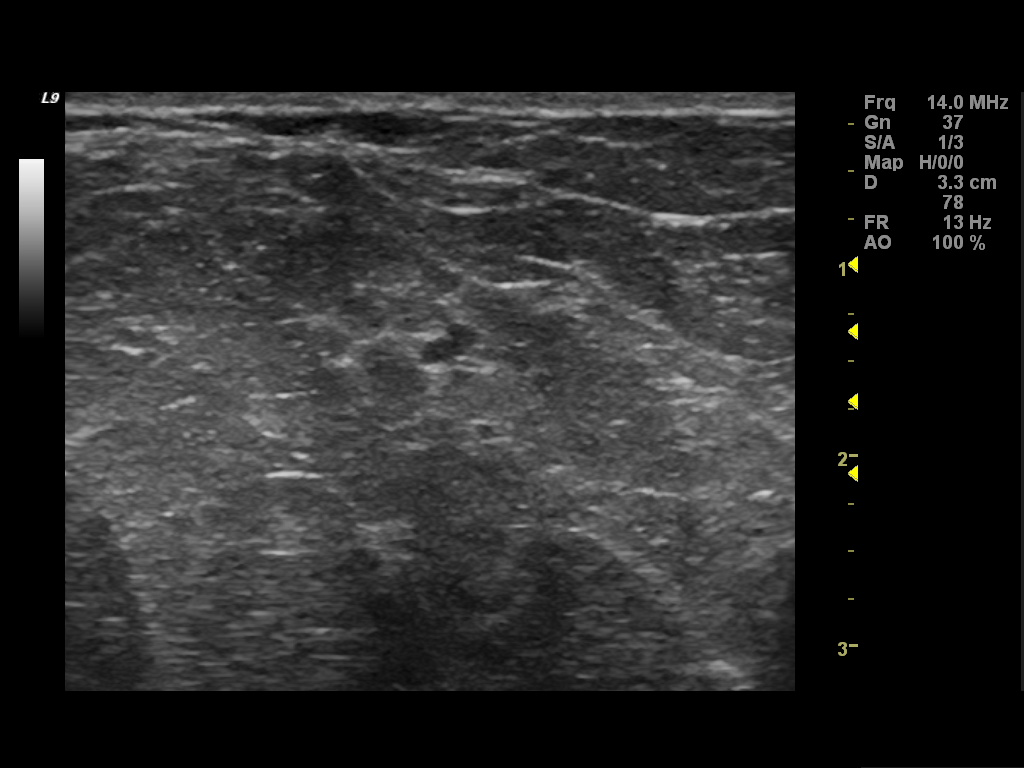
[im 2/6]
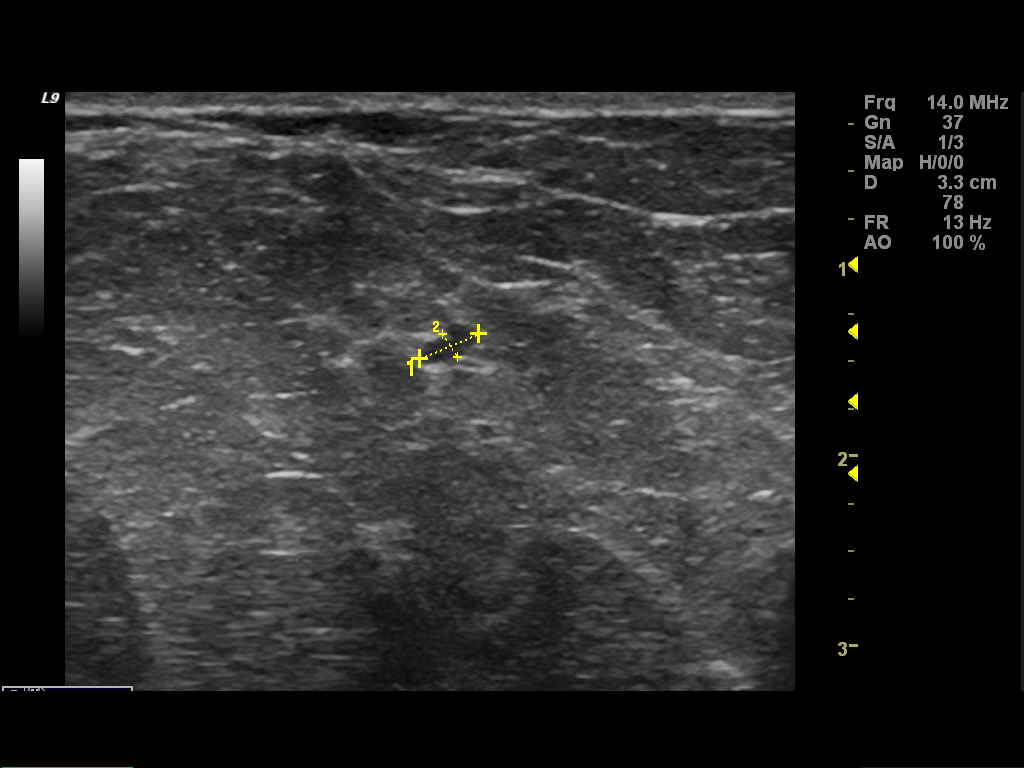
[im 3/6]
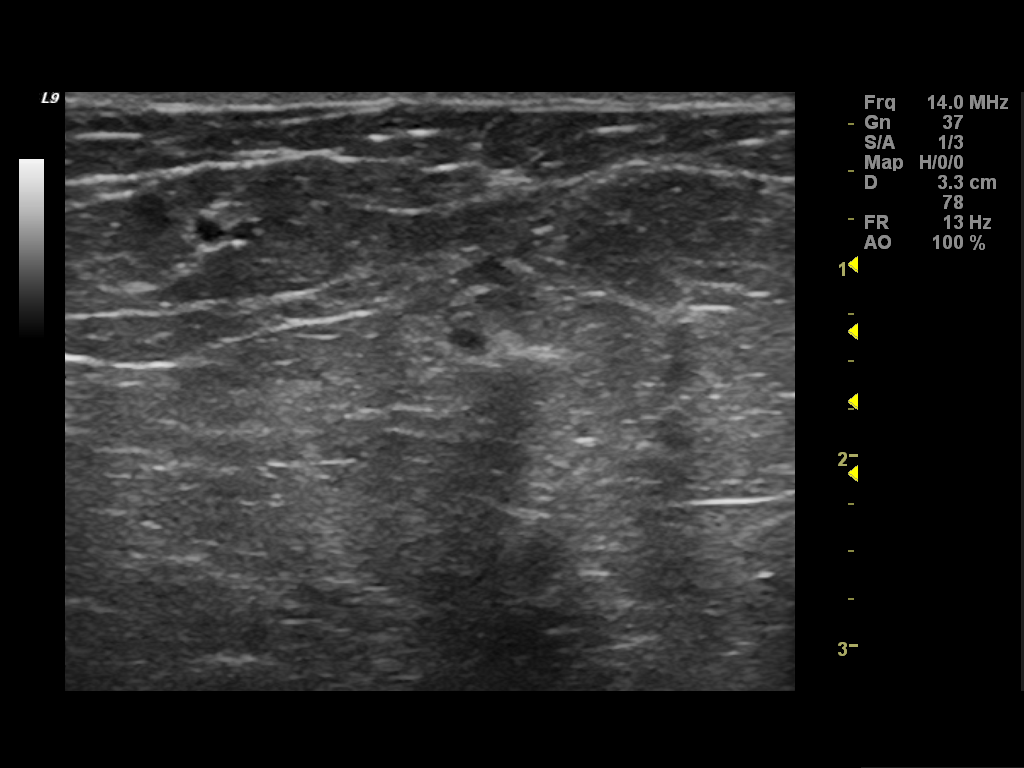
[im 4/6]
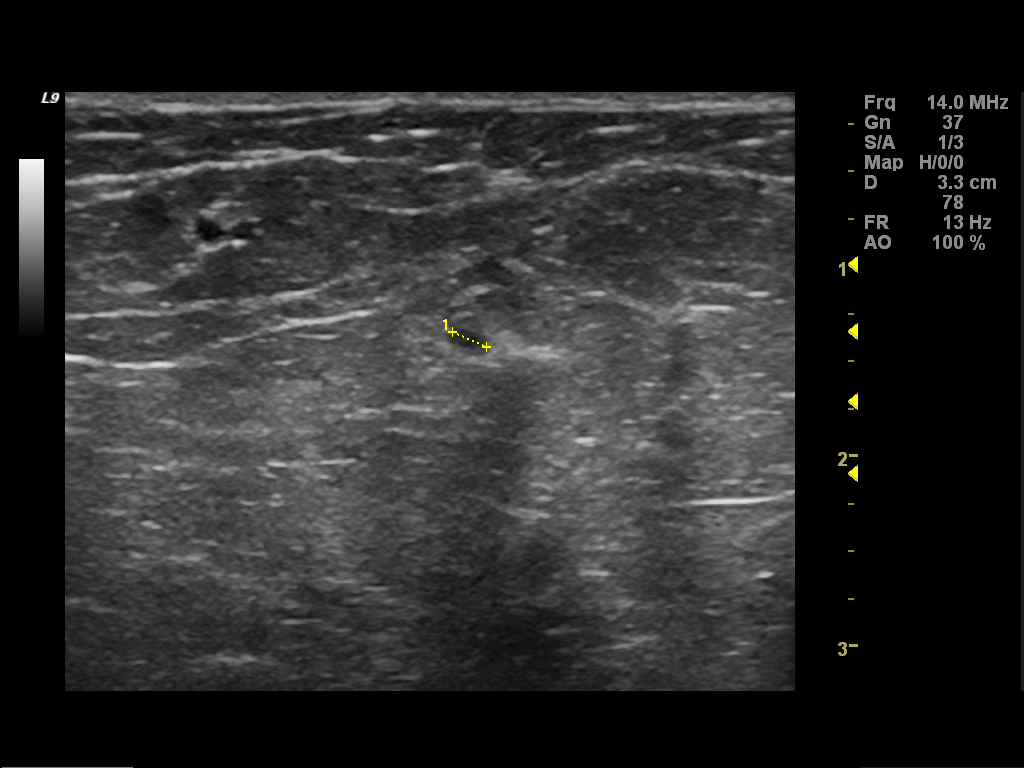
[im 5/6]
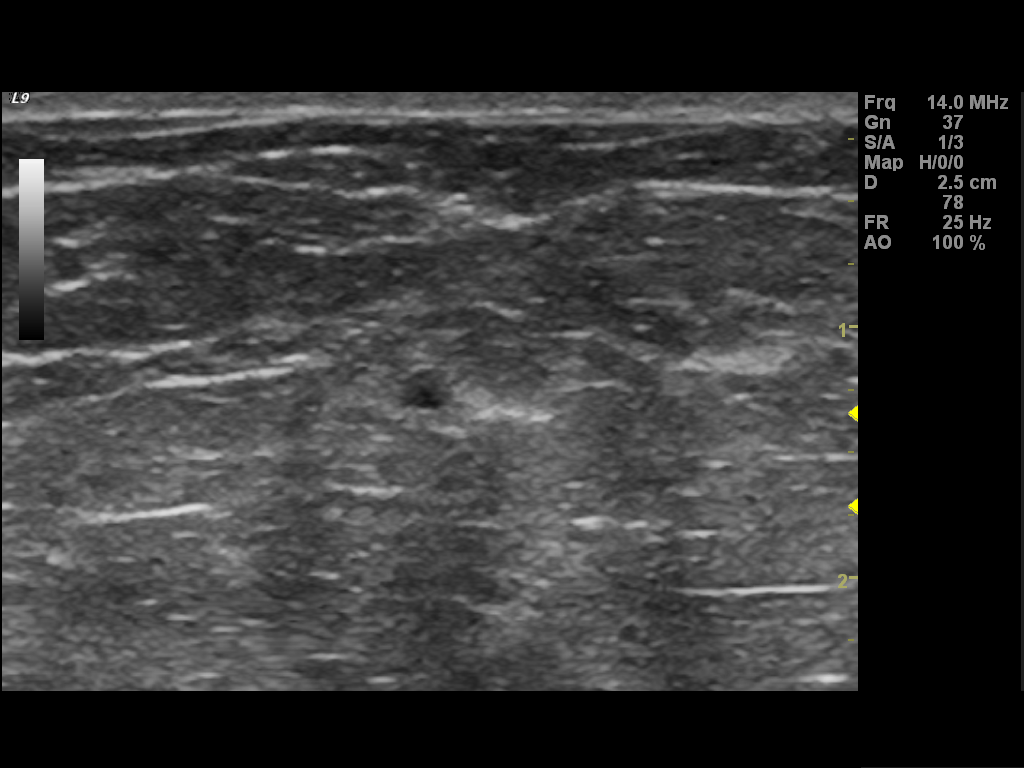
[im 6/6]
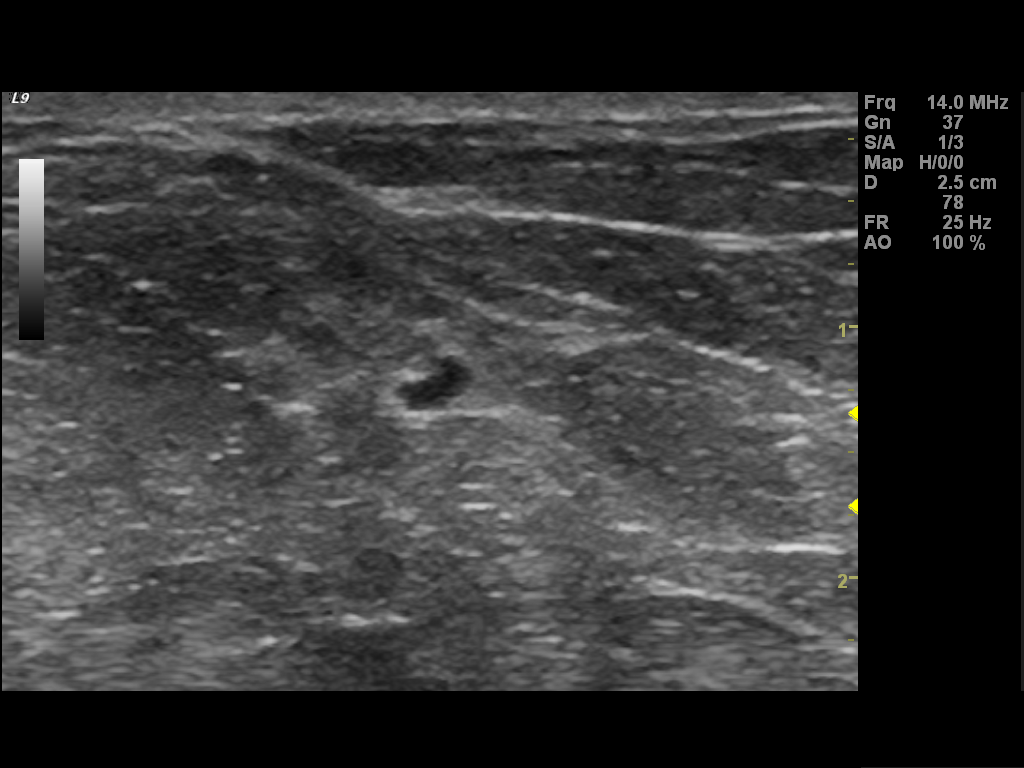

[6 of 6 positions shown; findings below may reference images not displayed]

FINDINGS: Small mass persists on spot compression imaging. It measures
approximate 2 mm in size, is oval, relatively lucent, with
circumscribed margins.

Ultrasound is performed, showing a small elongated cyst in the
lateral right breast at 9 o'clock, 12 cm from nipple measuring
mm x 1.4 mm by 2 mm. This corresponds in size, shape and location to
the mammographic abnormality. There are no solid masses or
suspicious lesions.
IMPRESSION: Small benign right breast cyst.  No evidence of malignancy.

RECOMMENDATION:
Screening mammogram in one year.(Code:[id number])

I have discussed the findings and recommendations with the patient.
Results were also provided in writing at the conclusion of the
visit. If applicable, a reminder letter will be sent to the patient
regarding the next appointment.

BI-RADS CATEGORY  2: Benign.

## 2016-04-29 ENCOUNTER — Other Ambulatory Visit: Payer: Self-pay | Admitting: Family Medicine

## 2016-04-29 DIAGNOSIS — Z1231 Encounter for screening mammogram for malignant neoplasm of breast: Secondary | ICD-10-CM

## 2016-04-30 ENCOUNTER — Ambulatory Visit
Admission: RE | Admit: 2016-04-30 | Discharge: 2016-04-30 | Disposition: A | Discharge status: Home / Self Care | Payer: BLUE CROSS/BLUE SHIELD | Source: Ambulatory Visit | Attending: Family Medicine | Admitting: Family Medicine

## 2016-04-30 DIAGNOSIS — Z1231 Encounter for screening mammogram for malignant neoplasm of breast: Secondary | ICD-10-CM

## 2016-05-02 NOTE — Progress Notes (Deleted)
Stacy Boyer is a 51 y.o. female who presents for a complete physical.  She has the following concerns:  She is s/p hysterectomy and BSO 02/2014 by Dr. Quincy Simmonds.  She had endometrial polyp removed and dilation and curettage for abnormal uterine bleeding on 01/16/14. 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 No evidence of malignancy.  H/o hyperlipidemia: tries to follow a lowfat, low cholesterol diet Lab Results  Component Value Date   CHOL 179 06/01/2012   HDL 42 06/01/2012   LDLCALC 109 (H) 06/01/2012   TRIG 142 06/01/2012   CHOLHDL 4.3 06/01/2012     Immunization History  Administered Date(s) Administered  . Influenza Split 04/13/2011, 02/04/2014  . Influenza,inj,Quad PF,36+ Mos 02/06/2013  . Tdap 04/13/2011   Last Pap smear: 03/2011; s/p hysterectomy 02/2014 (benign) Last mammogram: 04/2016 Last colonoscopy: never Last DEXA: never Dentist: twice a year Ophtho: wears glasses Exercise: walks Vitamin D-OH was 32 in 05/2012 (also low-normal in 2012)    ROS: The patient denies anorexia, fever, vision changes, decreased hearing, ear pain, sore throat, breast concerns, chest pain, palpitations, dizziness, syncope, dyspnea on exertion, swelling, nausea, vomiting, diarrhea, constipation, abdominal pain, melena, hematochezia, indigestion/heartburn, hematuria, incontinence, dysuria; no vaginal discharge, odor or itch, genital lesions, numbness, tingling, weakness, tremor, suspicious skin lesions, depression, anxiety, abnormal bleeding/bruising, or enlarged lymph nodes.  +periods are heavier and seem to last longer, irregular. +fatigue; +weight gain over the last several years.   Slight cough started yesterday, otherwise hasn't been sick. +weight gain as per HPI Shoulder pain resolved after PT (which included trigger point injections) Oral HSV infrequent (only 1 episode since November).    PHYSICAL EXAM:   General Appearance:  Alert, cooperative, no  distress, appears stated age, overweight   Head:  Normocephalic, without obvious abnormality, atraumatic   Eyes:  PERRL, conjunctiva/corneas clear, EOM's intact, fundi  benign   Ears:  Normal TM's and external ear canals   Nose:  Nares normal, mucosa normal, no drainage or sinus tenderness   Throat:  Lips, mucosa, and tongue normal; teeth and gums normal   Neck:  Supple, no lymphadenopathy; thyroid: no enlargement/tenderness/nodules; no carotid  bruit or JVD   Back:  Spine nontender, no curvature, ROM normal, no CVA tenderness   Lungs:  Clear to auscultation bilaterally without wheezes, rales or ronchi; respirations unlabored   Chest Wall:  No tenderness or deformity   Heart:  Regular rate and rhythm, S1 and S2 normal, no murmur, rub  or gallop   Breast Exam:  No tenderness, masses, or nipple discharge or inversion. No axillary lymphadenopathy   Abdomen:  Soft, non-tender, nondistended, normoactive bowel sounds,  no masses, no hepatosplenomegaly   Genitalia:  Normal external genitalia without lesions. BUS and vagina normal; cervix without motion tenderness. No abnormal vaginal discharge, although some brown discharge/spotting noted on glove after bimanual exam. Uterus and adnexa not enlarged, nontender, no masses. Pap not performed   Rectal:  Normal tone, no masses or tenderness; guaiac positive stool, but likely contaminated by vaginal spotting  Extremities:  No clubbing, cyanosis or edema   Pulses:  2+ and symmetric all extremities.   Skin:  Skin color, texture, turgor normal, no rashes or lesions   Lymph nodes:  Cervical, supraclavicular, and axillary nodes normal   Neurologic:  CNII-XII intact, normal strength, sensation and gait; reflexes 2+ and symmetric throughout  Psych: Normal mood, affect, hygiene and grooming.    ASSESSMENT/PLAN:   CBC, c-met, TSH, lipid  H/o elevated LFT's  Discussed monthly self breast exams and yearly mammograms after the  age of 23; at least 30 minutes of aerobic activity at least 5 days/week; proper sunscreen use reviewed; healthy diet, including goals of calcium and vitamin D intake and alcohol recommendations (less than or equal to 1 drink/day) reviewed; regular seatbelt use; changing batteries in smoke detectors. Immunization recommendations discussed-- Colonoscopy recommendations reviewed--past due  Shingrix when available

## 2016-05-04 ENCOUNTER — Encounter: Payer: BLUE CROSS/BLUE SHIELD | Admitting: Family Medicine

## 2016-05-04 ENCOUNTER — Ambulatory Visit (INDEPENDENT_AMBULATORY_CARE_PROVIDER_SITE_OTHER): Payer: BLUE CROSS/BLUE SHIELD | Admitting: Family Medicine

## 2016-05-04 ENCOUNTER — Encounter: Payer: Self-pay | Admitting: Family Medicine

## 2016-05-04 VITALS — BP 130/80 | HR 80 | Resp 18 | Ht 65.5 in | Wt 197.8 lb

## 2016-05-04 DIAGNOSIS — E785 Hyperlipidemia, unspecified: Secondary | ICD-10-CM | POA: Diagnosis not present

## 2016-05-04 DIAGNOSIS — Z23 Encounter for immunization: Secondary | ICD-10-CM

## 2016-05-04 DIAGNOSIS — Z1211 Encounter for screening for malignant neoplasm of colon: Secondary | ICD-10-CM | POA: Diagnosis not present

## 2016-05-04 DIAGNOSIS — B001 Herpesviral vesicular dermatitis: Secondary | ICD-10-CM

## 2016-05-04 DIAGNOSIS — L659 Nonscarring hair loss, unspecified: Secondary | ICD-10-CM | POA: Diagnosis not present

## 2016-05-04 DIAGNOSIS — Z0000 Encounter for general adult medical examination without abnormal findings: Secondary | ICD-10-CM | POA: Diagnosis not present

## 2016-05-04 DIAGNOSIS — R5383 Other fatigue: Secondary | ICD-10-CM

## 2016-05-04 LAB — CBC WITH DIFFERENTIAL/PLATELET
BASOS PCT: 1 %
Basophils Absolute: 57 cells/uL (ref 0–200)
Eosinophils Absolute: 114 cells/uL (ref 15–500)
Eosinophils Relative: 2 %
HCT: 43 % (ref 35.0–45.0)
Hemoglobin: 14.3 g/dL (ref 11.7–15.5)
LYMPHS PCT: 34 %
Lymphs Abs: 1938 cells/uL (ref 850–3900)
MCH: 30.2 pg (ref 27.0–33.0)
MCHC: 33.3 g/dL (ref 32.0–36.0)
MCV: 90.9 fL (ref 80.0–100.0)
MONOS PCT: 9 %
MPV: 10.7 fL (ref 7.5–12.5)
Monocytes Absolute: 513 cells/uL (ref 200–950)
NEUTROS ABS: 3078 {cells}/uL (ref 1500–7800)
Neutrophils Relative %: 54 %
PLATELETS: 233 10*3/uL (ref 140–400)
RBC: 4.73 MIL/uL (ref 3.80–5.10)
RDW: 13.9 % (ref 11.0–15.0)
WBC: 5.7 10*3/uL (ref 4.0–10.5)

## 2016-05-04 LAB — LIPID PANEL
Cholesterol: 213 mg/dL — ABNORMAL HIGH (ref <200)
HDL: 49 mg/dL — ABNORMAL LOW (ref >50)
LDL CALC: 129 mg/dL — AB (ref <100)
TRIGLYCERIDES: 176 mg/dL — AB (ref <150)
Total CHOL/HDL Ratio: 4.3 Ratio (ref <5.0)
VLDL: 35 mg/dL — AB (ref <30)

## 2016-05-04 LAB — COMPREHENSIVE METABOLIC PANEL
ALK PHOS: 83 U/L (ref 33–130)
ALT: 18 U/L (ref 6–29)
AST: 18 U/L (ref 10–35)
Albumin: 4 g/dL (ref 3.6–5.1)
BILIRUBIN TOTAL: 0.3 mg/dL (ref 0.2–1.2)
BUN: 15 mg/dL (ref 7–25)
CO2: 27 mmol/L (ref 20–31)
CREATININE: 1.07 mg/dL — AB (ref 0.50–1.05)
Calcium: 9.2 mg/dL (ref 8.6–10.4)
Chloride: 108 mmol/L (ref 98–110)
GLUCOSE: 95 mg/dL (ref 65–99)
Potassium: 4.4 mmol/L (ref 3.5–5.3)
SODIUM: 144 mmol/L (ref 135–146)
Total Protein: 7 g/dL (ref 6.1–8.1)

## 2016-05-04 LAB — TSH: TSH: 2.92 mIU/L

## 2016-05-04 MED ORDER — VALACYCLOVIR HCL 1 G PO TABS: ORAL_TABLET | ORAL | 1 refills | Status: DC

## 2016-05-04 NOTE — Progress Notes (Signed)
Chief Complaint  Patient presents with  . Annual Exam    needs refill of valacyclovir. had complete hysterectomy in 2015- does she need PAP? pt with no concerns. pt is fasting. sees Dr. Nicki Reaper eye doctor. Unable to get urine specimen.    Stacy Boyer is a 51 y.o. female who presents for a complete physical.  She has the following concerns:  She is s/p hysterectomy and BSO 02/2014 by Dr. Quincy Simmonds.  She had endometrial polyp removed and dilation and curettage for abnormal uterine bleeding on 01/16/14. 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  Wasn't put on hormones, hasn't had problems with hot flashes, night sweats.  Hasn't seen GYN for routine care since then.  H/o hyperlipidemia: tries to follow a lowfat, low cholesterol diet Lab Results  Component Value Date   CHOL 179 06/01/2012   HDL 42 06/01/2012   LDLCALC 109 (H) 06/01/2012   TRIG 142 06/01/2012   CHOLHDL 4.3 06/01/2012   Oral HSV-- infrequent flares (1-2x/year, has one currently). Needs refill  Immunization History  Administered Date(s) Administered  . Influenza Split 04/13/2011, 02/04/2014  . Influenza,inj,Quad PF,36+ Mos 02/06/2013  . Tdap 04/13/2011    Last Pap smear: 03/2011; s/p hysterectomy 02/2014 (benign) Last mammogram: 04/2016 Last colonoscopy: never Last DEXA: never Dentist: twice a year Ophtho: wears glasses, last was 2 years ago Exercise: since March she has been swimming 3-4 days/week (60-90 minutes), recently started increasing the intensity. Some bodyweight training (squats, planks). Vitamin D-OH was 32 in 05/2012 (also low-normal in 2012).  She has been inconsistent with her MVI  Past Medical History:  Diagnosis Date  . Allergy    seasonal and dust  . Anemia    Hx  . Arthritis    hands  . Herpes simplex labialis   . Mixed hyperlipidemia    diet controlled  . Other abnormal blood chemistry   . SVD (spontaneous vaginal delivery)    x 1    Past Surgical History:   Procedure Laterality Date  . CESAREAN SECTION     x 1  . CYSTOSCOPY N/A 03/05/2014   Procedure: CYSTOSCOPY;  Surgeon: Jamey Reas de Berton Lan, MD;  Location: [facility];  Service: Gynecology;  Laterality: N/A;  . DILATION AND CURETTAGE OF UTERUS  1995   miscarrige  . DILATION AND CURETTAGE OF UTERUS  2015   complex atypical endometrial hyperplasia  . EYE SURGERY     Lasik eye surgery  . REFRACTIVE SURGERY    . ROBOTIC ASSISTED TOTAL HYSTERECTOMY WITH BILATERAL SALPINGO OOPHERECTOMY N/A 03/05/2014   Procedure: ROBOTIC ASSISTED TOTAL HYSTERECTOMY WITH BILATERAL SALPINGO OOPHORECTOMY WITH PELVIC WASHINGS;  Surgeon: Jamey Reas de Berton Lan, MD;  Location: [facility];  Service: Gynecology;  Laterality: N/A;  . TONSILLECTOMY  age 72  . WISDOM TOOTH EXTRACTION      Social History   Social History  . Marital status: Married    Spouse name: N/A  . Number of children: 2  . Years of education: N/A   Occupational History  . works for [employer]    Social History Main Topics  . Smoking status: Never Smoker  . Smokeless tobacco: Never Used  . Alcohol use 0.6 oz/week    1 Standard drinks or equivalent per week     Comment: 2-3 drinks/month, or less; more over holidays  . Drug use: No  . Sexual activity: Yes    Partners: Male    Birth control/ protection: None  Comment: husband had vasectomy/R-TLH/BSO   Other Topics Concern  . Not on file   Social History Narrative   Lives with husband; has 2 sons (youngest attends [employer]; oldest is a Airline pilot, lives at the [place]); 3 dogs.   Teaches online classes (networking)    Family History  Problem Relation Age of Onset  . Hyperlipidemia Mother   . Diabetes Mother   . Melanoma Mother   . Arthritis Mother   . Arthritis Father     rheumatoid  . Diabetes Father   . Heart disease Father 34    MI, s/p CABG  . Hypertension Father   . Hyperlipidemia Father   . Atrial  fibrillation Father   . Diabetes Brother     resolved after gastric bypass/weight loss   . Hypertension Brother     off meds s/p weight loss  . Heart disease Maternal Grandmother     25 or younger?  . Stroke Maternal Grandmother     34's  . Cancer Paternal Grandmother     breast cancer in 34's  . Cancer Paternal Aunt     breast cancer in 2 paternal aunts  . Cancer Maternal Aunt     lung cancer (smoker)    Outpatient Encounter Prescriptions as of 05/04/2016  Medication Sig Note  . b complex vitamins tablet Take 1 tablet by mouth daily. Reported on 08/26/2015   . calcium carbonate (TUMS - DOSED IN MG ELEMENTAL CALCIUM) 500 MG chewable tablet Chew 1 tablet by mouth as needed for heartburn (for Calcium). Reported on 08/26/2015   . cetirizine (ZYRTEC) 10 MG tablet Take 10 mg by mouth daily. Reported on 08/26/2015 05/04/2016: Uses prn  . valACYclovir (VALTREX) 1000 MG tablet Take 2 tablets at onset of cold sore.  Repeat dose of additional 2 tablets in 12 hours (4 tablets per episode)   . [DISCONTINUED] valACYclovir (VALTREX) 1000 MG tablet Take 2 tablets at onset of cold sore.  Repeat dose of additional 2 tablets in 12 hours (4 tablets per episode) 05/04/2016: Took last one today, for cold sore  . naproxen sodium (ANAPROX) 220 MG tablet Take 440 mg by mouth 2 (two) times daily with a meal. Reported on 08/26/2015 05/04/2016: Uses prn, rarely for a headache. No longer needs for hand/shoulder pain  . Omega-3 Fatty Acids (FISH OIL) 500 MG CAPS Take 1 capsule by mouth daily. Reported on 08/26/2015 05/04/2016: Hasn't taken in a long time  . [DISCONTINUED] amoxicillin (AMOXIL) 875 MG tablet Take 1 tablet (875 mg total) by mouth 2 (two) times daily.   . [DISCONTINUED] benzonatate (TESSALON) 200 MG capsule Take 1 capsule (200 mg total) by mouth 3 (three) times daily as needed for cough.    No facility-administered encounter medications on file as of 05/04/2016.     Allergies  Allergen Reactions  . Lactose Intolerance  (Gi)     GI upset   ROS: The patient denies anorexia, fever, vision changes, decreased hearing, ear pain, sore throat, breast concerns, chest pain, palpitations, dizziness, syncope, dyspnea on exertion, swelling, nausea, vomiting, diarrhea, constipation, abdominal pain, melena, hematochezia, indigestion/heartburn, hematuria, dysuria; no vaginal bleeding, discharge, odor or itch, genital lesions, numbness, tingling, weakness, tremor, suspicious skin lesions, depression, anxiety, abnormal bleeding/bruising, or enlarged lymph nodes.  Mole at her right elbow was slightly itchy last week Some stress urinary incontinence (cough/sneeze) Has current cold sore Thinning hair, a little more than in the past (started after childbirth). Intentional weight loss--15-20# over the last year.  Feels "stuck"  at current weight. Goes to sleep late (due to work); wakes up earlier than she would expect to.  Gets energized from swimming, no daytime somnolence.   PHYSICAL EXAM:  BP 130/80   Pulse 80   Resp 18   Ht 5' 5.5" (1.664 m)   Wt 197 lb 12.8 oz (89.7 kg)   LMP 11/25/2013   SpO2 98%   BMI 32.42 kg/m   General Appearance:  Alert, cooperative, no distress, appears stated age, overweight   Head:  Normocephalic, without obvious abnormality, atraumatic   Eyes:  PERRL, conjunctiva/corneas clear, EOM's intact, fundi benign   Ears:  Normal TM's and external ear canals   Nose:  Nares normal, mucosa normal, no drainage or sinus tenderness   Throat:  Lips, mucosa, and tongue normal; teeth and gums normal. Healing cold sore on lower lip  Neck:  Supple, no lymphadenopathy; thyroid: no enlargement/tenderness/nodules; no carotid  bruit or JVD   Back:  Spine nontender, no curvature, ROM normal, no CVA tenderness   Lungs:  Clear to auscultation bilaterally without wheezes, rales or ronchi; respirations unlabored   Chest Wall:  No tenderness or deformity   Heart:  Regular rate and rhythm, S1 and S2 normal, no  murmur, rub or gallop   Breast Exam:  No tenderness, masses, or nipple discharge or inversion. No axillary lymphadenopathy   Abdomen:  Soft, non-tender, nondistended, normoactive bowel sounds, no masses, no hepatosplenomegaly   Genitalia:  Normal external genitalia without lesions. BUS and vagina normal; uterus absent.  No adnexal tenderness or masses. Pap not performed   Rectal:  Normal tone, no masses or tenderness; guaiac negative stool  Extremities:  No clubbing, cyanosis or edema   Pulses:  2+ and symmetric all extremities.   Skin:  Skin color, texture, turgor normal, no rashes or lesions. Uniform light brown, slightly raised mole at her right elbow  Lymph nodes:  Cervical, supraclavicular, and axillary nodes normal   Neurologic:  CNII-XII intact, normal strength, sensation and gait; reflexes 2+ and symmetric throughout   Psych: Normal mood, affect, hygiene and grooming.     ASSESSMENT/PLAN:  Annual physical exam - Plan: CBC with Differential/Platelet, Comprehensive metabolic panel, TSH, VITAMIN D 25 Hydroxy (Vit-D Deficiency, Fractures), Lipid panel  Herpes simplex labialis - Plan: valACYclovir (VALTREX) 1000 MG tablet  Hyperlipidemia, unspecified hyperlipidemia type - reviewed low cholesterol diet  Hair loss  Other fatigue - Plan: CBC with Differential/Platelet, Comprehensive metabolic panel, TSH, VITAMIN D 25 Hydroxy (Vit-D Deficiency, Fractures)  Needs flu shot - Plan: Flu Vaccine QUAD 36+ mos IM  Colon cancer screening - Plan: Ambulatory referral to Gastroenterology   CBC, c-met, TSH, lipid, Vit D H/o elevated LFT's   Discussed monthly self breast exams and yearly mammograms; at least 30 minutes of aerobic activity at least 5 days/week, weight-bearing exercise at least 2x/wk; proper sunscreen use reviewed; healthy diet, including goals of calcium and vitamin D intake and alcohol recommendations (less than or equal to 1 drink/day) reviewed;  regular seatbelt use; changing batteries in smoke detectors. Immunization recommendations discussed--flu shot given today. Shingrix when available. Colonoscopy recommendations reviewed--past due, referred to GI  F/u 1 year, sooner prn

## 2016-05-04 NOTE — Patient Instructions (Signed)
  HEALTH MAINTENANCE RECOMMENDATIONS:  It is recommended that you get at least 30 minutes of aerobic exercise at least 5 days/week (for weight loss, you may need as much as 60-90 minutes). This can be any activity that gets your heart rate up. This can be divided in 10-15 minute intervals if needed, but try and build up your endurance at least once a week.  Weight bearing exercise is also recommended twice weekly.  Eat a healthy diet with lots of vegetables, fruits and fiber.  "Colorful" foods have a lot of vitamins (ie green vegetables, tomatoes, red peppers, etc).  Limit sweet tea, regular sodas and alcoholic beverages, all of which has a lot of calories and sugar.  Up to 1 alcoholic drink daily may be beneficial for women (unless trying to lose weight, watch sugars).  Drink a lot of water.  Calcium recommendations are 1200-1500 mg daily (1500 mg for postmenopausal women or women without ovaries), and vitamin D 1000 IU daily.  This should be obtained from diet and/or supplements (vitamins), and calcium should not be taken all at once, but in divided doses.  Monthly self breast exams and yearly mammograms for women over the age of 1 is recommended.  Sunscreen of at least SPF 30 should be used on all sun-exposed parts of the skin when outside between the hours of 10 am and 4 pm (not just when at beach or pool, but even with exercise, golf, tennis, and yard work!)  Use a sunscreen that says "broad spectrum" so it covers both UVA and UVB rays, and make sure to reapply every 1-2 hours.  Remember to change the batteries in your smoke detectors when changing your clock times in the spring and fall.  Use your seat belt every time you are in a car, and please drive safely and not be distracted with cell phones and texting while driving.  We are referring you for colonoscopy for screening for colon cancer.  I recommend getting the new shingles vaccine (Shingrix) when available. You will need to check with  your insurance to see if it is covered.  It is a series of 2 injections, spaced 2 months apart.

## 2016-05-05 LAB — VITAMIN D 25 HYDROXY (VIT D DEFICIENCY, FRACTURES): Vit D, 25-Hydroxy: 31 ng/mL (ref 30–100)

## 2016-05-11 ENCOUNTER — Encounter: Payer: Self-pay | Admitting: Internal Medicine

## 2016-06-05 ENCOUNTER — Encounter: Payer: Self-pay | Admitting: Family Medicine

## 2016-06-29 ENCOUNTER — Encounter: Payer: Self-pay | Admitting: Family Medicine

## 2016-07-29 ENCOUNTER — Encounter: Payer: Self-pay | Admitting: Family Medicine

## 2016-07-29 ENCOUNTER — Ambulatory Visit (INDEPENDENT_AMBULATORY_CARE_PROVIDER_SITE_OTHER): Payer: BLUE CROSS/BLUE SHIELD | Admitting: Family Medicine

## 2016-07-29 VITALS — BP 122/72 | HR 60 | Temp 97.9°F | Ht 65.5 in | Wt 198.6 lb

## 2016-07-29 DIAGNOSIS — J029 Acute pharyngitis, unspecified: Secondary | ICD-10-CM | POA: Diagnosis not present

## 2016-07-29 LAB — POCT RAPID STREP A (OFFICE): RAPID STREP A SCREEN: NEGATIVE

## 2016-07-29 NOTE — Progress Notes (Signed)
Chief Complaint  Patient presents with  . Sore Throat    throat is very sore, has a headache and she is very tired. Symptoms started Sat night.     3 days ago she started with sore throat.  She has fatigue and headache across the forehead.  Feels like there is postnasal drainage only at night--feels "crud" in the morning in her throat.  Otherwise denies any congestion.  Denies sniffle, sneeze, itchy eyes.  Rare cough, which she relates to her throat.  She has been taking Aleve--last dose 3-4 hours ago.  Yesterday morning she had chills and subjective warmth/fever.  Felt that yesterday morning only, not throughout the day (but was taking Aleve).  Aleve has been helping with the headache, not much help with her throat.  Lozenges don't help much.  Husband also has sore throat, but doesn't feel as bad overall.  Son was home about 2 weeks ago--got sick at the end of his spring break. Seen here and she reports he had pneumonia and tonsillitis.  h/o allergies--none recently other than some itchy eyes.  Hasn't been using zyrtec.  PMH, PSH, SH reviewed  Outpatient Encounter Prescriptions as of 07/29/2016  Medication Sig Note  . b complex vitamins tablet Take 1 tablet by mouth daily. Reported on 08/26/2015   . naproxen sodium (ANAPROX) 220 MG tablet Take 440 mg by mouth 2 (two) times daily with a meal. Reported on 08/26/2015 05/04/2016: Uses prn, rarely for a headache. No longer needs for hand/shoulder pain  . Omega-3 Fatty Acids (FISH OIL) 500 MG CAPS Take 1 capsule by mouth daily. Reported on 08/26/2015   . TURMERIC PO Take 1 capsule by mouth daily.   . calcium carbonate (TUMS - DOSED IN MG ELEMENTAL CALCIUM) 500 MG chewable tablet Chew 1 tablet by mouth as needed for heartburn (for Calcium). Reported on 08/26/2015   . cetirizine (ZYRTEC) 10 MG tablet Take 10 mg by mouth daily. Reported on 08/26/2015 05/04/2016: Uses prn  . valACYclovir (VALTREX) 1000 MG tablet Take 2 tablets at onset of cold sore.  Repeat dose of  additional 2 tablets in 12 hours (4 tablets per episode) (Patient not taking: Reported on 07/29/2016)    No facility-administered encounter medications on file as of 07/29/2016.    Allergies  Allergen Reactions  . Lactose Intolerance (Gi)     GI upset    ROS:  Subjective fever and chills per HPI.  +HA and sore throat.  No ear pain, shortness of breath, chest pain, nausea, vomiting, diarrhea, bleeding, bruising, rash.  See HPI.  Stools are a little softer than usual for the last week.  PHYSICAL EXAM: BP 122/72 (BP Location: Left Arm, Patient Position: Sitting, Cuff Size: Normal)   Pulse 60   Temp 97.9 F (36.6 C) (Tympanic)   Ht 5' 5.5" (1.664 m)   Wt 198 lb 9.6 oz (90.1 kg)   LMP 12/11/2013   BMI 32.55 kg/m   Well appearing female in no distress.  Slightly hoarse voice, occasional throat clearing HEENT: PERRL, EOMI, conjunctiva and sclera are clear.  TM's and EAC's normal.  Nasal mucosa is mildly edematous, no erythema or purulence.  Sinuses nontender.  OP is normal.  No tonsils present.  Very slight erythema posteriorly. Neck: no lymphadenopathy or mass Heart: regular rate and rhythm, no murmur Lungs: clear bilaterally Skin: normal turgor, no rash  Rapid strep negative  ASSESSMENT/PLAN:  Sore throat - Plan: Rapid Strep A  No evidence of strep throat.  Given subjective  F/C, not likely related to allergies, more likely viral.  Supportive measures reviewed. f/u if symptoms persist/worsen    Continue aleve as needed for headache, fever, pain. You may also use tylenol, if needed. Continue supportive measures including trying salt water gargles, chloraseptic spray, vs warm/cold liquids. I suspect there may be a component of postnasal drainage--either from a virus vs allergies.  Restart the zyrtec.  If increasing sinus pain, you can also use a decongestant. If you develop any thick mucus or phlegm, or increasing cough, try Mucinex.  Return if persistent/worsening fever, ongoing  sore throat, or other new symptoms develop.

## 2016-07-29 NOTE — Patient Instructions (Addendum)
  Continue aleve as needed for headache, fever, pain. You may also use tylenol, if needed. Continue supportive measures including trying salt water gargles, chloraseptic spray, vs warm/cold liquids. I suspect there may be a component of postnasal drainage--either from a virus vs allergies.  Restart the zyrtec.  If increasing sinus pain, you can also use a decongestant. If you develop any thick mucus or phlegm, or increasing cough, try Mucinex.  Return if persistent/worsening fever, ongoing sore throat, or other new symptoms develop.  I recommend getting the new shingles vaccine (Shingrix) . You will need to check with your insurance to see if it is covered.  It is a series of 2 injections, spaced 2 months apart.  Please call to schedule your colonoscopy: [facility] [phone]

## 2016-10-22 ENCOUNTER — Encounter: Payer: Self-pay | Admitting: Family Medicine

## 2016-10-22 MED ORDER — BENZONATATE 200 MG PO CAPS: 200.0000 mg | ORAL_CAPSULE | Freq: Three times a day (TID) | ORAL | 0 refills | Status: AC | PRN

## 2016-12-02 ENCOUNTER — Encounter: Payer: BLUE CROSS/BLUE SHIELD | Admitting: Family Medicine

## 2018-02-09 ENCOUNTER — Other Ambulatory Visit: Payer: Self-pay | Admitting: *Deleted

## 2018-02-09 ENCOUNTER — Encounter: Payer: Self-pay | Admitting: Family Medicine

## 2018-02-09 DIAGNOSIS — B001 Herpesviral vesicular dermatitis: Secondary | ICD-10-CM

## 2018-02-09 MED ORDER — VALACYCLOVIR HCL 1 G PO TABS: ORAL_TABLET | ORAL | 0 refills | Status: AC

## 2018-02-09 NOTE — Telephone Encounter (Signed)
Patient has found new doctor yet in [place] so she will probably call back and schedule here.

## 2018-05-16 DIAGNOSIS — L659 Nonscarring hair loss, unspecified: Secondary | ICD-10-CM | POA: Diagnosis not present

## 2018-05-16 DIAGNOSIS — Z23 Encounter for immunization: Secondary | ICD-10-CM | POA: Diagnosis not present

## 2018-05-16 DIAGNOSIS — B001 Herpesviral vesicular dermatitis: Secondary | ICD-10-CM | POA: Diagnosis not present

## 2018-05-16 DIAGNOSIS — Z13220 Encounter for screening for lipoid disorders: Secondary | ICD-10-CM | POA: Diagnosis not present

## 2018-05-16 DIAGNOSIS — E6609 Other obesity due to excess calories: Secondary | ICD-10-CM | POA: Diagnosis not present

## 2018-05-16 DIAGNOSIS — Z1239 Encounter for other screening for malignant neoplasm of breast: Secondary | ICD-10-CM | POA: Diagnosis not present

## 2018-05-16 DIAGNOSIS — Z6837 Body mass index (BMI) 37.0-37.9, adult: Secondary | ICD-10-CM | POA: Diagnosis not present

## 2018-09-08 DIAGNOSIS — D225 Melanocytic nevi of trunk: Secondary | ICD-10-CM | POA: Diagnosis not present

## 2018-09-08 DIAGNOSIS — L853 Xerosis cutis: Secondary | ICD-10-CM | POA: Diagnosis not present

## 2018-09-08 DIAGNOSIS — L298 Other pruritus: Secondary | ICD-10-CM | POA: Diagnosis not present

## 2018-09-08 DIAGNOSIS — L820 Inflamed seborrheic keratosis: Secondary | ICD-10-CM | POA: Diagnosis not present

## 2018-09-08 DIAGNOSIS — L538 Other specified erythematous conditions: Secondary | ICD-10-CM | POA: Diagnosis not present

## 2018-09-08 DIAGNOSIS — L648 Other androgenic alopecia: Secondary | ICD-10-CM | POA: Diagnosis not present

## 2018-09-08 DIAGNOSIS — L309 Dermatitis, unspecified: Secondary | ICD-10-CM | POA: Diagnosis not present

## 2019-02-22 DIAGNOSIS — Z23 Encounter for immunization: Secondary | ICD-10-CM | POA: Diagnosis not present

## 2019-04-06 ENCOUNTER — Other Ambulatory Visit: Payer: Self-pay | Admitting: Family Medicine

## 2019-04-06 DIAGNOSIS — Z1231 Encounter for screening mammogram for malignant neoplasm of breast: Secondary | ICD-10-CM
